# Patient Record
Sex: Female | Born: 1980 | Race: Black or African American | Hispanic: No | Marital: Single | Smoking: Current every day smoker
Health system: Southern US, Community
[De-identification: ages and names within clinical notes are randomized; demographics above are authoritative.]

## PROBLEM LIST (undated history)

## (undated) ENCOUNTER — Inpatient Hospital Stay (HOSPITAL_COMMUNITY): Payer: Self-pay

## (undated) DIAGNOSIS — O139 Gestational [pregnancy-induced] hypertension without significant proteinuria, unspecified trimester: Secondary | ICD-10-CM

## (undated) DIAGNOSIS — Z8759 Personal history of other complications of pregnancy, childbirth and the puerperium: Secondary | ICD-10-CM

## (undated) HISTORY — DX: Personal history of other complications of pregnancy, childbirth and the puerperium: Z87.59

## (undated) HISTORY — PX: DILATION AND CURETTAGE OF UTERUS: SHX78

## (undated) HISTORY — PX: FOOT SURGERY: SHX648

---
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
## 2003-12-10 ENCOUNTER — Emergency Department (HOSPITAL_COMMUNITY): Admission: EM | Admit: 2003-12-10 | Discharge: 2003-12-11 | Payer: Self-pay | Admitting: Emergency Medicine

## 2006-09-04 ENCOUNTER — Inpatient Hospital Stay (HOSPITAL_COMMUNITY): Admission: AD | Admit: 2006-09-04 | Discharge: 2006-09-04 | Payer: Self-pay | Admitting: Gynecology

## 2006-09-20 ENCOUNTER — Ambulatory Visit: Payer: Self-pay | Admitting: Obstetrics & Gynecology

## 2006-09-26 ENCOUNTER — Ambulatory Visit (HOSPITAL_COMMUNITY): Admission: RE | Admit: 2006-09-26 | Discharge: 2006-09-26 | Payer: Self-pay | Admitting: Gynecology

## 2006-10-09 ENCOUNTER — Ambulatory Visit: Payer: Self-pay | Admitting: Gynecology

## 2006-10-09 ENCOUNTER — Encounter (INDEPENDENT_AMBULATORY_CARE_PROVIDER_SITE_OTHER): Payer: Self-pay | Admitting: Gynecology

## 2006-10-24 ENCOUNTER — Ambulatory Visit (HOSPITAL_COMMUNITY): Admission: RE | Admit: 2006-10-24 | Discharge: 2006-10-24 | Payer: Self-pay | Admitting: Gynecology

## 2006-11-01 ENCOUNTER — Ambulatory Visit: Payer: Self-pay | Admitting: Obstetrics & Gynecology

## 2006-11-10 ENCOUNTER — Ambulatory Visit: Payer: Self-pay | Admitting: Obstetrics & Gynecology

## 2006-11-29 ENCOUNTER — Ambulatory Visit: Payer: Self-pay | Admitting: Obstetrics & Gynecology

## 2006-12-20 ENCOUNTER — Ambulatory Visit: Payer: Self-pay | Admitting: Obstetrics & Gynecology

## 2007-01-03 ENCOUNTER — Ambulatory Visit: Payer: Self-pay | Admitting: Obstetrics & Gynecology

## 2007-01-10 ENCOUNTER — Ambulatory Visit: Payer: Self-pay | Admitting: Obstetrics & Gynecology

## 2007-01-15 ENCOUNTER — Ambulatory Visit: Payer: Self-pay | Admitting: Gynecology

## 2007-01-31 ENCOUNTER — Ambulatory Visit: Payer: Self-pay | Admitting: Obstetrics & Gynecology

## 2007-02-05 ENCOUNTER — Emergency Department (HOSPITAL_COMMUNITY): Admission: EM | Admit: 2007-02-05 | Discharge: 2007-02-05 | Payer: Self-pay | Admitting: Family Medicine

## 2007-02-07 ENCOUNTER — Ambulatory Visit (HOSPITAL_COMMUNITY): Admission: RE | Admit: 2007-02-07 | Discharge: 2007-02-07 | Payer: Self-pay | Admitting: Obstetrics & Gynecology

## 2007-02-21 ENCOUNTER — Ambulatory Visit: Payer: Self-pay | Admitting: Obstetrics & Gynecology

## 2007-02-26 ENCOUNTER — Ambulatory Visit (HOSPITAL_COMMUNITY): Admission: RE | Admit: 2007-02-26 | Discharge: 2007-02-26 | Payer: Self-pay | Admitting: Obstetrics & Gynecology

## 2007-02-26 ENCOUNTER — Ambulatory Visit: Payer: Self-pay | Admitting: Gynecology

## 2007-02-28 ENCOUNTER — Ambulatory Visit (HOSPITAL_COMMUNITY): Admission: RE | Admit: 2007-02-28 | Discharge: 2007-02-28 | Payer: Self-pay | Admitting: Obstetrics & Gynecology

## 2007-03-05 ENCOUNTER — Ambulatory Visit: Payer: Self-pay | Admitting: Gynecology

## 2007-03-07 ENCOUNTER — Ambulatory Visit: Payer: Self-pay | Admitting: Gynecology

## 2007-03-13 ENCOUNTER — Ambulatory Visit: Payer: Self-pay | Admitting: Gynecology

## 2007-03-15 ENCOUNTER — Ambulatory Visit: Payer: Self-pay | Admitting: Obstetrics & Gynecology

## 2007-03-20 ENCOUNTER — Ambulatory Visit: Payer: Self-pay | Admitting: Physician Assistant

## 2007-03-20 ENCOUNTER — Inpatient Hospital Stay (HOSPITAL_COMMUNITY): Admission: RE | Admit: 2007-03-20 | Discharge: 2007-03-23 | Payer: Self-pay | Admitting: Obstetrics & Gynecology

## 2007-05-09 ENCOUNTER — Ambulatory Visit: Payer: Self-pay | Admitting: Obstetrics & Gynecology

## 2007-05-16 ENCOUNTER — Ambulatory Visit: Payer: Self-pay | Admitting: Obstetrics & Gynecology

## 2008-07-18 ENCOUNTER — Emergency Department (HOSPITAL_COMMUNITY): Admission: EM | Admit: 2008-07-18 | Discharge: 2008-07-18 | Payer: Self-pay | Admitting: Family Medicine

## 2009-06-15 ENCOUNTER — Emergency Department (HOSPITAL_COMMUNITY): Admission: EM | Admit: 2009-06-15 | Discharge: 2009-06-15 | Payer: Self-pay | Admitting: Emergency Medicine

## 2010-06-21 LAB — RAPID STREP SCREEN (MED CTR MEBANE ONLY): Streptococcus, Group A Screen (Direct): POSITIVE — AB

## 2010-08-10 NOTE — Assessment & Plan Note (Signed)
NAME:  Beth Park, Beth Park               ACCOUNT NO.:  [id number]   MEDICAL RECORD NO.:  [id number]          PATIENT TYPE:  POB   LOCATION:  [facility] at [facility]         FACILITY:  [facility]   PHYSICIAN:  Tinnie Gens, MD        DATE OF BIRTH:  Feb 05, 1981   DATE OF SERVICE:                                  CLINIC NOTE   CHIEF COMPLAINT:  IUD insertion.   HISTORY OF PRESENT ILLNESS:  The patient is a 30 year old gravida 3,  para 2-1-0-2 who is 6 weeks status post delivery of a 6 pound 7 ounce  female fetus.  She is her for IUD placement. She had a fasting blood sugar  today was 86.  EPT was negative on exam. Vitals are as in the chart.  She is a well-developed, well-nourished female, no acute distress.   PROCEDURE:  __________ was placed on the vagina.  Cervix visualized,  cleaned with Betadine x2. Cervix grasped anteriorly with a single-tooth  tenaculum.  Uterus sounded to 8-1/2 cm. Mirena IUD placed without  difficulty.  Strings trimmed to 1/2 cm all from the vagina.  The patient  tolerated procedure well.   IMPRESSION:  1. Undesired fertility.  2. Status post IUD placement.   PLAN:  Follow-up 1-2 months for IUD string check.           ______________________________  Tinnie Gens, MD     TP/MEDQ  D:  05/16/2007  T:  05/17/2007  Job:  [id number]

## 2010-08-10 NOTE — Assessment & Plan Note (Signed)
NAME:  Beth Park, Beth Park               ACCOUNT NO.:  [id number]   MEDICAL RECORD NO.:  [id number]          PATIENT TYPE:  POB   LOCATION:  [facility] at [facility]         FACILITY:  [facility]   PHYSICIAN:  Tinnie Gens, MD        DATE OF BIRTH:  [birth year]   DATE OF SERVICE:                                  CLINIC NOTE   CHIEF COMPLAINT:  Postpartum check.   HISTORY OF PRESENT ILLNESS:  The patient is a 30 year old gravida 3,  para 2-1-0-2, who is now 6 weeks status post vaginal delivery of a 6-  pound, 7-ounce female fetus.  She has been back to work approximately 3  days.  She is bottle feeding, and she is planning for an IUD next week.  She has not been sexually active since birth.  The patient had abnormal  1 hour, with normal 3-hour, with the exception of an abnormal fasting of  113.  The patient was not treated as a diabetic during this pregnancy.  The patient reports that her mood is better, now she is back at work.  She does not think that she is significantly depressed.  She is just  tired a lot.  Her baby is not sleeping through the night yet.  Her last  Pap smear was in July 2008.   EXAMINATION:  VITAL SIGNS:  On exam, her vitals are as noted in the  chart.  GENERAL:  She well-nourished female in no acute distress.  ABDOMEN:  Soft, nontender, nondistended.  GU:  Normal external female genitalia.  Vagina is pink and rugated.  BUS  is normal. Cervix is parous without lesion.  Uterus was 6 weeks' size,  anteverted.  No adnexal mass or tenderness.   IMPRESSION:  1. Postpartum check.  2. History of abnormal fasting blood sugar during pregnancy.   PLAN:  1. Will check her random CABG at her next visit.  2. Return 1 week for IUD placement.  3. GC and chlamydia cultures were obtained today.           ______________________________  Tinnie Gens, MD     TP/MEDQ  D:  05/09/2007  T:  05/10/2007  Job:  [id number]

## 2010-08-13 NOTE — Discharge Summary (Signed)
[facility]  Patient:    Beth Park, Beth Park                      MRN: [id number] Adm. Date:  [id number] Disc. Date: [id number] Attending:  Antionette Char Dictator:   Ocie Doyne, M.D.                           Discharge Summary  DATE OF BIRTH:                03-Jul-1980  DISCHARGE DIAGNOSES:          1. Eclampsia on postpartum day nine.                               2. Pyelonephritis.  DISCHARGE MEDICATIONS:        1. Bactrim DS one p.o. b.i.d. for nine days.                               2. Labetalol 100 mg p.o. b.i.d.                               3. Prenatal vitamin p.o. q.d.  ADMISSION HISTORY AND PHYSICAL:                 This 30 year old, G2, P1-0-1-1, on postpartum day nine following a spontaneous vaginal delivery presented to [facility] Emergency Department following a three-day history of headache unrelieved by Tylenol or Motrin. The morning of admission, while in the bathroom at home, she had a grand mal seizure, lost consciousness, hit her head, bit her tongue, and lost control of her bladder. The seizure was witnessed by family members. She was brought to the [facility] Emergency Department by EMS where she received a 6 g bolus of magnesium sulfate and [facility]. Her pg had not been complicated by hypertension or other evidence of preeclampsia and for the first week after discharge home, she had been doing well. On admission exam her blood pressure was 147/81, heart rate 93, and she was tired but oriented. Pertinent exam findings included: Lungs clear to auscultation. No right upper quadrant tenderness. No CVA tenderness. She did have both pedal and hand edema. She did not have clonus but did have brisk reflexes.  ADMISSION LABORATORIES:       Sodium 136, potassium 4.0, chloride 109, bicarb 24, BUN 15, creatinine 0.9, glucose 81. White blood cells 10.9, hemoglobin 12.6, platelets of 321,000. LDH 253, uric  acid 5.5, AST 32, ALT 70, and a magnesium level on arrival was 3.1. PT was 12.2, PTT 26.  Urinalysis: positive nitrites, small blood, and low protein. Microscopic analysis showed 6-10 red blood cells, 11-20 white blood cells, and many bacteria. A urine drug screen was positive for opiates and marijuana.  Head CT was negative for an acute bleed.  HOSPITAL COURSE:              The patient was admitted to the AICU for close observation and continued on magnesium sulfate at 2.5 g per hour. She was started on Cefotan for presumptive pyelonephritis as shortly following admission, she developed a fever to 100.6 and a catheterized urine sample was cultured. Her labs began slowly trending downward and on hospital day  three, she began to diurese. At that time she was feeling well, denied headache or vision changes, and was no longer spiking fevers. Her urine culture revealed 1000 colonies of E. coli but had been collected after a dose of IM Rocephin had already been administered. On hospital day four, as her labs continued to normalize, magnesium sulfate was discontinued and she was transferred the patient to the antenatal unit. Unfortunately, shortly following her transfer, she left her room to go outside and smoke and when she returned to her room was noted to have a blood pressure 177/113. Because it could not be determined whether her hypertension was secondary to preeclampsia or to nicotine abuse, she returned to the AICU and was restarted on magnesium for an additional 24 hours. She also began labetalol 100 mg b.i.d.  On hospital day six her labs had almost entirely normalized. She continued to diurese, had an unremarkable physical exam, was denying headache, left upper quadrant abdominal pain or vision changes, and had been afebrile for greater than 24 hours. She was changed to Bactrim DS to continue a 14-day course for pyelonephritis. Her magnesium sulfate was discontinued and she was  monitored closely over the next 12 hours during which time she remained stable. She will be discharged home to continue a 14-day course of Bactrim and continue on labetalol until she is followed up at [facility] for her six week postpartum visit. DD:  06/10/00 TD:  06/11/00 Job: [id number]

## 2010-12-12 ENCOUNTER — Emergency Department (HOSPITAL_COMMUNITY)
Admission: EM | Admit: 2010-12-12 | Discharge: 2010-12-13 | Disposition: A | Discharge status: Home / Self Care | Payer: BC Managed Care – PPO | Attending: Emergency Medicine | Admitting: Emergency Medicine

## 2010-12-12 DIAGNOSIS — L0201 Cutaneous abscess of face: Secondary | ICD-10-CM | POA: Insufficient documentation

## 2010-12-12 DIAGNOSIS — B9689 Other specified bacterial agents as the cause of diseases classified elsewhere: Secondary | ICD-10-CM | POA: Insufficient documentation

## 2010-12-12 DIAGNOSIS — A499 Bacterial infection, unspecified: Secondary | ICD-10-CM | POA: Insufficient documentation

## 2010-12-12 DIAGNOSIS — R109 Unspecified abdominal pain: Secondary | ICD-10-CM | POA: Insufficient documentation

## 2010-12-12 DIAGNOSIS — L03211 Cellulitis of face: Secondary | ICD-10-CM | POA: Insufficient documentation

## 2010-12-12 DIAGNOSIS — N760 Acute vaginitis: Secondary | ICD-10-CM | POA: Insufficient documentation

## 2010-12-12 LAB — URINALYSIS, ROUTINE W REFLEX MICROSCOPIC
Nitrite: NEGATIVE
Specific Gravity, Urine: 1.034 — ABNORMAL HIGH (ref 1.005–1.030)
Urobilinogen, UA: 1 mg/dL (ref 0.0–1.0)

## 2010-12-12 LAB — CBC
Hemoglobin: 12.9 g/dL (ref 12.0–15.0)
MCH: 29.3 pg (ref 26.0–34.0)
MCV: 83.4 fL (ref 78.0–100.0)
Platelets: 266 10*3/uL (ref 150–400)
RBC: 4.4 MIL/uL (ref 3.87–5.11)

## 2010-12-12 LAB — POCT PREGNANCY, URINE: Preg Test, Ur: NEGATIVE

## 2010-12-12 LAB — URINE MICROSCOPIC-ADD ON

## 2010-12-13 LAB — COMPREHENSIVE METABOLIC PANEL
BUN: 15 mg/dL (ref 6–23)
CO2: 27 mEq/L (ref 19–32)
Calcium: 9.4 mg/dL (ref 8.4–10.5)
Creatinine, Ser: 0.81 mg/dL (ref 0.50–1.10)
GFR calc Af Amer: >60 mL/min (ref >60)
GFR calc non Af Amer: >60 mL/min (ref >60)
Glucose, Bld: 97 mg/dL (ref 70–99)

## 2010-12-13 LAB — DIFFERENTIAL
Eosinophils Absolute: 0.2 10*3/uL (ref 0.0–0.7)
Lymphs Abs: 5.8 10*3/uL — ABNORMAL HIGH (ref 0.7–4.0)
Monocytes Relative: 5 % (ref 3–12)
Neutrophils Relative %: 39 % — ABNORMAL LOW (ref 43–77)

## 2010-12-13 LAB — WET PREP, GENITAL: Trich, Wet Prep: NONE SEEN

## 2010-12-31 LAB — COMPREHENSIVE METABOLIC PANEL
Albumin: 2.6 — ABNORMAL LOW
BUN: 7
Chloride: 103
Creatinine, Ser: 0.59
Glucose, Bld: 122 — ABNORMAL HIGH
Total Bilirubin: 0.4
Total Protein: 5.8 — ABNORMAL LOW

## 2010-12-31 LAB — WOUND CULTURE: Gram Stain: NONE SEEN

## 2010-12-31 LAB — CBC
HCT: 34.2 — ABNORMAL LOW
Hemoglobin: 13
MCV: 86.6
Platelets: 174
RDW: 13.3
RDW: 13.6

## 2010-12-31 LAB — RPR: RPR Ser Ql: NONREACTIVE

## 2010-12-31 LAB — URIC ACID: Uric Acid, Serum: 3.2

## 2010-12-31 LAB — LACTATE DEHYDROGENASE: LDH: 128

## 2011-01-13 LAB — URINALYSIS, ROUTINE W REFLEX MICROSCOPIC
Bilirubin Urine: NEGATIVE
Glucose, UA: NEGATIVE
Hgb urine dipstick: NEGATIVE
Ketones, ur: NEGATIVE
Nitrite: POSITIVE — AB
pH: 6

## 2011-01-13 LAB — URINE MICROSCOPIC-ADD ON

## 2011-08-20 ENCOUNTER — Emergency Department (HOSPITAL_COMMUNITY)
Admission: EM | Admit: 2011-08-20 | Discharge: 2011-08-20 | Disposition: A | Discharge status: Home / Self Care | Payer: BC Managed Care – PPO | Attending: Emergency Medicine | Admitting: Emergency Medicine

## 2011-08-20 ENCOUNTER — Encounter (HOSPITAL_COMMUNITY): Payer: Self-pay | Admitting: Emergency Medicine

## 2011-08-20 DIAGNOSIS — W57XXXA Bitten or stung by nonvenomous insect and other nonvenomous arthropods, initial encounter: Secondary | ICD-10-CM | POA: Insufficient documentation

## 2011-08-20 DIAGNOSIS — T148 Other injury of unspecified body region: Secondary | ICD-10-CM | POA: Insufficient documentation

## 2011-08-20 DIAGNOSIS — F17200 Nicotine dependence, unspecified, uncomplicated: Secondary | ICD-10-CM | POA: Insufficient documentation

## 2011-08-20 NOTE — ED Provider Notes (Signed)
History     CSN: [id number]  Arrival date & time 08/20/11  0058   First MD Initiated Contact with Patient 08/20/11 0236      Chief Complaint  Patient presents with  . skin irritation   . Mass    HPI  History provided by the patient. She is a 31 year old female with no significant past medical history who presents with concerns for bumps on left wrist and forehead.she first noticed these 2 days ago. Bones been associated with itching. Patient was concerned because people noticed a bump on her for head and has swollen some. Patient denies any pain to the areas. She is unaware of any insect bites. She has not slept in many locations. She is not noting new pet contacts.she has not tried anything for her symptoms. She denies any other aggravating or alleviating factors.Patient denies any other symptoms.patient denies any fever, chills, sweats    History reviewed. No pertinent past medical history.  Past Surgical History  Procedure Date  . Dilation and curettage of uterus     History reviewed. No pertinent family history.  History  Substance Use Topics  . Smoking status: Current Everyday Smoker    Types: Cigarettes  . Smokeless tobacco: Not on file  . Alcohol Use: Yes    OB History    Grav Para Term Preterm Abortions TAB SAB Ect Mult Living                  Review of Systems  Constitutional: Negative for fever and chills.  Skin: Negative for rash.    Allergies  Review of patient's allergies indicates no known allergies.  Home Medications  No current outpatient prescriptions on file.  BP 131/76  Pulse 84  Temp(Src) 98.4 F (36.9 C) (Oral)  Resp 16  Wt 200 lb (90.719 kg)  SpO2 100%  LMP 08/09/2011  Physical Exam  Nursing note and vitals reviewed. Constitutional: She is oriented to person, place, and time. She appears well-developed and well-nourished. No distress.  HENT:  Head: Normocephalic and atraumatic.  Cardiovascular: Normal rate and regular rhythm.     Pulmonary/Chest: Effort normal and breath sounds normal.  Neurological: She is alert and oriented to person, place, and time.  Skin: Skin is warm and dry.        Single macular papular lesion on the anterior medial left wrist.similar macular papular lesion on the left for head.  Psychiatric: She has a normal mood and affect. Her behavior is normal.    ED Course  Procedures     1. Insect bites       MDM  Patient seen and evaluated. Patient no acute distress the        Angus Seller, [place] 08/20/11 [phone]

## 2011-08-20 NOTE — ED Notes (Signed)
Pt presented with complains that she develop 2 bumps, 1st located to the left wrist and 2nd one to her forehead, pt states " at first I thought it was mosquito bite but I am not sure", denies pain to the area, noted minimal swelling and redness

## 2011-08-20 NOTE — ED Provider Notes (Signed)
Medical screening examination/treatment/procedure(s) were performed by non-physician practitioner and as supervising physician I was immediately available for consultation/collaboration.   Aahan Marques L Kynslee Baham, MD 08/20/11 0713 

## 2011-08-20 NOTE — Discharge Instructions (Signed)
You were seen and evaluated for lumps on your wrist and forehead. At this time your providers do not think these are caused from any emergent condition. Your providers do feel these could be insect bites that cause swelling and itching. These will resolve on their own in the next few days. You can use warm compresses over the area to help with improvement. Avoid scratching the skin.   Insect Bite Mosquitoes, flies, fleas, bedbugs, and many other insects can bite. Insect bites are different from insect stings. A sting is when venom is injected into the skin. Some insect bites can transmit infectious diseases. SYMPTOMS  Insect bites usually turn red, swell, and itch for 2 to 4 days. They often go away on their own. TREATMENT  Your caregiver may prescribe antibiotic medicines if a bacterial infection develops in the bite. HOME CARE INSTRUCTIONS  Do not scratch the bite area.   Keep the bite area clean and dry. Wash the bite area thoroughly with soap and water.   Put ice or cool compresses on the bite area.   Put ice in a plastic bag.   Place a towel between your skin and the bag.   Leave the ice on for 20 minutes, 4 times a day for the first 2 to 3 days, or as directed.   You may apply a baking soda paste, cortisone cream, or calamine lotion to the bite area as directed by your caregiver. This can help reduce itching and swelling.   Only take over-the-counter or prescription medicines as directed by your caregiver.   If you are given antibiotics, take them as directed. Finish them even if you start to feel better.  You may need a tetanus shot if:  You cannot remember when you had your last tetanus shot.   You have never had a tetanus shot.   The injury broke your skin.  If you get a tetanus shot, your arm may swell, get red, and feel warm to the touch. This is common and not a problem. If you need a tetanus shot and you choose not to have one, there is a rare chance of getting  tetanus. Sickness from tetanus can be serious. SEEK IMMEDIATE MEDICAL CARE IF:   You have increased pain, redness, or swelling in the bite area.   You see a red line on the skin coming from the bite.   You have a fever.   You have joint pain.   You have a headache or neck pain.   You have unusual weakness.   You have a rash.   You have chest pain or shortness of breath.   You have abdominal pain, nausea, or vomiting.   You feel unusually tired or sleepy.  MAKE SURE YOU:   Understand these instructions.   Will watch your condition.   Will get help right away if you are not doing well or get worse.  Document Released: 04/21/2004 Document Revised: 03/03/2011 Document Reviewed: 10/13/2010 [facility] Patient Information 2012 [place], [place].   RESOURCE GUIDE  Dental Problems  Patients with Medicaid: [facility] [phone]                                           [phone] [address]  [employer] Phone:  [phone]                                                  Phone:  [phone]  If unable to pay or uninsured, contact:  [facility] or [facility]. to become qualified for the adult dental clinic.  Chronic Pain Problems Contact [facility]  ([phone] Patients need to be referred by their primary care doctor.  Insufficient Money for Medicine Contact [employer]:  call "211" or [facility] 304-204-7274.  No Primary Care Doctor Call [facility]  740 323 6015 Other agencies that provide inexpensive medical care    [facility]  ([phone]    [facility]  [phone]    [facility]  ([phone]    [facility]  670-865-9995    [facility]  513-687-1968    [facility]  [phone]  Psychological Services [facility]  ([phone] [facility] Services  [phone] [facility]   [phone] (emergency services  870-466-0447)  Substance Abuse Resources [facility]  (386)392-4630 [facility] (563)611-8405 [facility] 570-325-4116 Floydene Flock 475-425-4247 Residential & Outpatient Substance Abuse Program  [phone]  Abuse/Neglect [facility] Child Abuse Hotline 305-133-3354 [facility] Child Abuse Hotline 270-295-9259 (After Hours)  Emergency Shelter [facility] 718 279 5375  Maternity [facility] [place] [facility] (787) 831-9404 [facility] 6231435099  MRSA Hotline #:   225-780-4327    [facility] Resources  [facility] [place]     [employer]                          [facility] Dept. [address] [place]                       [address]      [address]  Blondell Reveal Phone:  [phone]                                   Phone:  [phone]                 Phone:  [phone]  [facility] Phone:  [phone]  [facility] Child Abuse Hotline (315)652-8497 417-823-5990 (After Hours)

## 2011-08-20 NOTE — ED Notes (Signed)
Bump to L side of forehead and L wrist. Pt states these bumps have gotten bigger. Denies pain at site.

## 2012-03-12 ENCOUNTER — Ambulatory Visit: Payer: BC Managed Care – PPO | Admitting: Obstetrics & Gynecology

## 2012-03-12 DIAGNOSIS — Z01419 Encounter for gynecological examination (general) (routine) without abnormal findings: Secondary | ICD-10-CM

## 2014-09-05 ENCOUNTER — Inpatient Hospital Stay (HOSPITAL_COMMUNITY): Payer: Self-pay

## 2014-09-05 ENCOUNTER — Inpatient Hospital Stay (HOSPITAL_COMMUNITY)
Admission: AD | Admit: 2014-09-05 | Discharge: 2014-09-05 | Disposition: A | Discharge status: Home / Self Care | Payer: Self-pay | Source: Ambulatory Visit | Attending: Obstetrics & Gynecology | Admitting: Obstetrics & Gynecology

## 2014-09-05 ENCOUNTER — Encounter (HOSPITAL_COMMUNITY): Payer: Self-pay | Admitting: *Deleted

## 2014-09-05 DIAGNOSIS — O208 Other hemorrhage in early pregnancy: Secondary | ICD-10-CM | POA: Insufficient documentation

## 2014-09-05 DIAGNOSIS — Z3A08 8 weeks gestation of pregnancy: Secondary | ICD-10-CM | POA: Insufficient documentation

## 2014-09-05 DIAGNOSIS — O418X10 Other specified disorders of amniotic fluid and membranes, first trimester, not applicable or unspecified: Secondary | ICD-10-CM

## 2014-09-05 DIAGNOSIS — O210 Mild hyperemesis gravidarum: Secondary | ICD-10-CM | POA: Insufficient documentation

## 2014-09-05 DIAGNOSIS — O4691 Antepartum hemorrhage, unspecified, first trimester: Secondary | ICD-10-CM

## 2014-09-05 DIAGNOSIS — O209 Hemorrhage in early pregnancy, unspecified: Secondary | ICD-10-CM

## 2014-09-05 DIAGNOSIS — O468X1 Other antepartum hemorrhage, first trimester: Secondary | ICD-10-CM

## 2014-09-05 HISTORY — DX: Gestational (pregnancy-induced) hypertension without significant proteinuria, unspecified trimester: O13.9

## 2014-09-05 LAB — URINALYSIS, ROUTINE W REFLEX MICROSCOPIC
BILIRUBIN URINE: NEGATIVE
GLUCOSE, UA: NEGATIVE mg/dL
Ketones, ur: NEGATIVE mg/dL
LEUKOCYTES UA: NEGATIVE
NITRITE: NEGATIVE
PH: 6 (ref 5.0–8.0)
Protein, ur: NEGATIVE mg/dL
Specific Gravity, Urine: 1.025 (ref 1.005–1.030)
Urobilinogen, UA: 0.2 mg/dL (ref 0.0–1.0)

## 2014-09-05 LAB — CBC WITH DIFFERENTIAL/PLATELET
BASOS PCT: 0 % (ref 0–1)
Basophils Absolute: 0 10*3/uL (ref 0.0–0.1)
EOS PCT: 2 % (ref 0–5)
Eosinophils Absolute: 0.2 10*3/uL (ref 0.0–0.7)
HCT: 35.2 % — ABNORMAL LOW (ref 36.0–46.0)
Hemoglobin: 12.2 g/dL (ref 12.0–15.0)
Lymphocytes Relative: 35 % (ref 12–46)
Lymphs Abs: 4.1 10*3/uL — ABNORMAL HIGH (ref 0.7–4.0)
MCH: 29 pg (ref 26.0–34.0)
MCHC: 34.7 g/dL (ref 30.0–36.0)
MCV: 83.6 fL (ref 78.0–100.0)
MONO ABS: 0.7 10*3/uL (ref 0.1–1.0)
MONOS PCT: 6 % (ref 3–12)
NEUTROS PCT: 57 % (ref 43–77)
Neutro Abs: 6.8 10*3/uL (ref 1.7–7.7)
Platelets: 239 10*3/uL (ref 150–400)
RBC: 4.21 MIL/uL (ref 3.87–5.11)
RDW: 13.2 % (ref 11.5–15.5)
WBC: 11.8 10*3/uL — ABNORMAL HIGH (ref 4.0–10.5)

## 2014-09-05 LAB — URINE MICROSCOPIC-ADD ON

## 2014-09-05 LAB — ABO/RH: ABO/RH(D): O POS

## 2014-09-05 LAB — WET PREP, GENITAL
TRICH WET PREP: NONE SEEN
Yeast Wet Prep HPF POC: NONE SEEN

## 2014-09-05 LAB — POCT PREGNANCY, URINE: PREG TEST UR: POSITIVE — AB

## 2014-09-05 LAB — HCG, QUANTITATIVE, PREGNANCY: HCG, BETA CHAIN, QUANT, S: 111409 m[IU]/mL — AB (ref <5)

## 2014-09-05 NOTE — MAU Provider Note (Signed)
History     CSN: [id number]  Arrival date and time: 09/05/14 1404   First Provider Initiated Contact with Patient 09/05/14 1627      Chief Complaint  Patient presents with  . Abdominal Pain  . Vaginal Bleeding  . Possible Pregnancy   HPI Beth Park 34 y.o. [id number] @[redacted]w[redacted]d  presents to MAU with a couple weeks of abdominal pain, spotting and nausea and vomiting.  Pain is 1/47 presently but certain positions bring it on and it can be 4/10 and very quick.  It is sharp.   It can happen more than once in a day but does not interfere with ADLs.  She feels full more.    Nausea and vomiting are unrelated to pain.  This comes more with hunger but any time of day.  Worse with coughing.  She has not tried any medication for these symptoms.  She has noticed spotting on wiping only.  She wears a panty liner but it never gets on there.  No spotting today.  Denies fever, weakness, SOB, CP.   She does not yet have [facility] provider.   OB History    Gravida Para Term Preterm AB TAB SAB Ectopic Multiple Living   4 2 2  1  1   2       Past Medical History  Diagnosis Date  . Pregnancy induced hypertension     Past Surgical History  Procedure Laterality Date  . Dilation and curettage of uterus      History reviewed. No pertinent family history.  History  Substance Use Topics  . Smoking status: Current Every Day Smoker -- 0.50 packs/day    Types: Cigarettes  . Smokeless tobacco: Not on file  . Alcohol Use: No    Allergies: No Known Allergies  Prescriptions prior to admission  Medication Sig Dispense Refill Last Dose  . Multiple Vitamins-Minerals (MULTIVITAMIN PO) Take 1 tablet by mouth at bedtime.   Past Week at Unknown time    ROS Pertinent ROS in HPI.  All other systems are negative.   Physical Exam   Blood pressure 113/70, pulse 99, temperature 98.9 F (37.2 C), temperature source Oral, resp. rate 18, height 5\' 5"  (1.651 m), weight 226 lb (102.513 kg), last menstrual period  07/09/2014.  Physical Exam  Constitutional: She is oriented to person, place, and time. She appears well-developed and well-nourished. No distress.  HENT:  Head: Normocephalic and atraumatic.  Eyes: EOM are normal.  Neck: Normal range of motion.  Cardiovascular: Normal rate, regular rhythm and normal heart sounds.   Respiratory: Effort normal and breath sounds normal. No respiratory distress.  GI: Soft. She exhibits no distension.  Genitourinary:  NO visible blood.  Scant white discharge Cervix is closed.  No CMT.  NO adnexal mass or tenderness  Musculoskeletal: Normal range of motion.  Neurological: She is alert and oriented to person, place, and time.  Skin: Skin is warm and dry.  Psychiatric: She has a normal mood and affect.    MAU Course  Procedures  MDM Ectopic workup ordered to confirm location of pregnancy due to pt complaints of abdominal pain and bleeding despite no abdominal tenderness on exam and no vaginal bleeding on exam.  Results for orders placed or performed during the hospital encounter of 09/05/14 (from the past 24 hour(s))  Urinalysis, Routine w reflex microscopic (not at [facility])     Status: Abnormal   Collection Time: 09/05/14  2:25 PM  Result Value Ref Range   Color, Urine  YELLOW YELLOW   APPearance CLEAR CLEAR   Specific Gravity, Urine 1.025 1.005 - 1.030   pH 6.0 5.0 - 8.0   Glucose, UA NEGATIVE NEGATIVE mg/dL   Hgb urine dipstick TRACE (A) NEGATIVE   Bilirubin Urine NEGATIVE NEGATIVE   Ketones, ur NEGATIVE NEGATIVE mg/dL   Protein, ur NEGATIVE NEGATIVE mg/dL   Urobilinogen, UA 0.2 0.0 - 1.0 mg/dL   Nitrite NEGATIVE NEGATIVE   Leukocytes, UA NEGATIVE NEGATIVE  Urine microscopic-add on     Status: None   Collection Time: 09/05/14  2:25 PM  Result Value Ref Range   Squamous Epithelial / LPF RARE RARE   WBC, UA 0-2 <3 WBC/hpf   RBC / HPF 0-2 <3 RBC/hpf   Bacteria, UA RARE RARE   Urine-Other MUCOUS PRESENT   Pregnancy, urine POC     Status:  Abnormal   Collection Time: 09/05/14  2:30 PM  Result Value Ref Range   Preg Test, Ur POSITIVE (A) NEGATIVE  Wet prep, genital     Status: Abnormal   Collection Time: 09/05/14  4:36 PM  Result Value Ref Range   Yeast Wet Prep HPF POC NONE SEEN NONE SEEN   Trich, Wet Prep NONE SEEN NONE SEEN   Clue Cells Wet Prep HPF POC FEW (A) NONE SEEN   WBC, Wet Prep HPF POC FEW (A) NONE SEEN  CBC with Differential/Platelet     Status: Abnormal   Collection Time: 09/05/14  4:58 PM  Result Value Ref Range   WBC 11.8 (H) 4.0 - 10.5 K/uL   RBC 4.21 3.87 - 5.11 MIL/uL   Hemoglobin 12.2 12.0 - 15.0 g/dL   HCT 35.2 (L) 36.0 - 46.0 %   MCV 83.6 78.0 - 100.0 fL   MCH 29.0 26.0 - 34.0 pg   MCHC 34.7 30.0 - 36.0 g/dL   RDW 13.2 11.5 - 15.5 %   Platelets 239 150 - 400 K/uL   Neutrophils Relative % 57 43 - 77 %   Neutro Abs 6.8 1.7 - 7.7 K/uL   Lymphocytes Relative 35 12 - 46 %   Lymphs Abs 4.1 (H) 0.7 - 4.0 K/uL   Monocytes Relative 6 3 - 12 %   Monocytes Absolute 0.7 0.1 - 1.0 K/uL   Eosinophils Relative 2 0 - 5 %   Eosinophils Absolute 0.2 0.0 - 0.7 K/uL   Basophils Relative 0 0 - 1 %   Basophils Absolute 0.0 0.0 - 0.1 K/uL  ABO/Rh     Status: None (Preliminary result)   Collection Time: 09/05/14  4:58 PM  Result Value Ref Range   ABO/RH(D) O POS   hCG, quantitative, pregnancy     Status: Abnormal   Collection Time: 09/05/14  4:58 PM  Result Value Ref Range   hCG, Beta Chain, Quant, S 111409 (H) <5 mIU/mL   US Ob Comp Less 14 Wks  09/05/2014   CLINICAL DATA:  Acute onset of vaginal bleeding. Initial encounter. Generalized abdominal pain.  EXAM: OBSTETRIC <14 WK [place] AND TRANSVAGINAL OB US  TECHNIQUE: Both transabdominal and transvaginal ultrasound examinations were performed for complete evaluation of the gestation as well as the maternal uterus, adnexal regions, and pelvic cul-de-sac. Transvaginal technique was performed to assess early pregnancy.  COMPARISON:  Pelvic ultrasound performed  02/28/2007  FINDINGS: Intrauterine gestational sac: Visualized/normal in shape.  Yolk sac:  Yes  Embryo:  Yes  Cardiac Activity: Yes  Heart Rate: 150  bpm  CRL:  1.58 cm   8 w  0 d                  [place] EDC: 04/17/2015  Maternal uterus/adnexae: A small amount of subchorionic hemorrhage is suggested. A 4.4 cm fibroid is noted at the left lateral aspect of the uterus.  The right ovary measures 3.8 x 2.0 x 2.2 cm, while the left ovary is not definitely seen. There is no evidence to suggest ectopic pregnancy.  No free fluid is seen within the pelvic cul-de-sac.  IMPRESSION: 1. Single live intrauterine pregnancy noted, with a crown-rump length of 1.6 cm, corresponding to a gestational age of [redacted] weeks 0 days. This matches the gestational age of [redacted] weeks 2 days by LMP, reflecting an estimated date of delivery of April 15, 2015. 2. Small amount of subchorionic hemorrhage suggested. 3. 4.4 cm uterine fibroid seen.   Electronically Signed   By: Garald Balding M.D.   On: 09/05/2014 18:15   US Ob Transvaginal  09/05/2014   CLINICAL DATA:  Acute onset of vaginal bleeding. Initial encounter. Generalized abdominal pain.  EXAM: OBSTETRIC <14 WK [place] AND TRANSVAGINAL OB US  TECHNIQUE: Both transabdominal and transvaginal ultrasound examinations were performed for complete evaluation of the gestation as well as the maternal uterus, adnexal regions, and pelvic cul-de-sac. Transvaginal technique was performed to assess early pregnancy.  COMPARISON:  Pelvic ultrasound performed 02/28/2007  FINDINGS: Intrauterine gestational sac: Visualized/normal in shape.  Yolk sac:  Yes  Embryo:  Yes  Cardiac Activity: Yes  Heart Rate: 150  bpm  CRL:  1.58 cm   8 w   0 d                  [place] EDC: 04/17/2015  Maternal uterus/adnexae: A small amount of subchorionic hemorrhage is suggested. A 4.4 cm fibroid is noted at the left lateral aspect of the uterus.  The right ovary measures 3.8 x 2.0 x 2.2 cm, while the left ovary is not definitely seen. There is  no evidence to suggest ectopic pregnancy.  No free fluid is seen within the pelvic cul-de-sac.  IMPRESSION: 1. Single live intrauterine pregnancy noted, with a crown-rump length of 1.6 cm, corresponding to a gestational age of [redacted] weeks 0 days. This matches the gestational age of [redacted] weeks 2 days by LMP, reflecting an estimated date of delivery of April 15, 2015. 2. Small amount of subchorionic hemorrhage suggested. 3. 4.4 cm uterine fibroid seen.   Electronically Signed   By: Garald Balding M.D.   On: 09/05/2014 18:15   IUP confirmed (ectopic ruled out).  NO evidence of infection/condition requiring further workup or treatment at this time.  Assessment and Plan  A: Intrauterine pregnancy, Subchorionic hemorrhage  P: Discharge to home Pregnancy verification letter. PNV qd HD for [facility] Patient may return to MAU as needed or if her condition were to change or worsen    Paticia Stack 09/05/2014, 4:28 PM

## 2014-09-05 NOTE — MAU Note (Signed)
Been having some abd pains, past wk. Bled yesterday. Might be preg, +HPT couple wks ago.

## 2014-09-05 NOTE — Discharge Instructions (Signed)
Pelvic Rest Pelvic rest is sometimes recommended for women when:   The placenta is partially or completely covering the opening of the cervix (placenta previa).  There is bleeding between the uterine wall and the amniotic sac in the first trimester (subchorionic hemorrhage).  The cervix begins to open without labor starting (incompetent cervix, cervical insufficiency).  The labor is too early (preterm labor). HOME CARE INSTRUCTIONS  Do not have sexual intercourse, stimulation, or an orgasm.  Do not use tampons, douche, or put anything in the vagina.  Do not lift anything over 10 pounds (4.5 kg).  Avoid strenuous activity or straining your pelvic muscles. SEEK MEDICAL CARE IF:  You have any vaginal bleeding during pregnancy. Treat this as a potential emergency.  You have cramping pain felt low in the stomach (stronger than menstrual cramps).  You notice vaginal discharge (watery, mucus, or bloody).  You have a low, dull backache.  There are regular contractions or uterine tightening. SEEK IMMEDIATE MEDICAL CARE IF: You have vaginal bleeding and have placenta previa.  Document Released: 07/09/2010 Document Revised: 06/06/2011 Document Reviewed: 07/09/2010 [facility] Patient Information 2015 [place], [place]. This information is not intended to replace advice given to you by your health care provider. Make sure you discuss any questions you have with your health care provider.  Vaginal Bleeding During Pregnancy, First Trimester A small amount of bleeding (spotting) from the vagina is common in early pregnancy. Sometimes the bleeding is normal and is not a problem, and sometimes it is a sign of something serious. Be sure to tell your doctor about any bleeding from your vagina right away. HOME CARE  Watch your condition for any changes.  Follow your doctor's instructions about how active you can be.  If you are on bed rest:  You may need to stay in bed and only get up to use the  bathroom.  You may be allowed to do some activities.  If you need help, make plans for someone to help you.  Write down:  The number of pads you use each day.  How often you change pads.  How soaked (saturated) your pads are.  Do not use tampons.  Do not douche.  Do not have sex or orgasms until your doctor says it is okay.  If you pass any tissue from your vagina, save the tissue so you can show it to your doctor.  Only take medicines as told by your doctor.  Do not take aspirin because it can make you bleed.  Keep all follow-up visits as told by your doctor. GET HELP IF:   You bleed from your vagina.  You have cramps.  You have labor pains.  You have a fever that does not go away after you take medicine. GET HELP RIGHT AWAY IF:   You have very bad cramps in your back or belly (abdomen).  You pass large clots or tissue from your vagina.  You bleed more.  You feel light-headed or weak.  You pass out (faint).  You have chills.  You are leaking fluid or have a gush of fluid from your vagina.  You pass out while pooping (having a bowel movement). MAKE SURE YOU:  Understand these instructions.  Will watch your condition.  Will get help right away if you are not doing well or get worse. Document Released: 07/29/2013 Document Reviewed: 11/19/2012 [facility] Patient Information 2015 [place]. This information is not intended to replace advice given to you by your health care provider. Make  sure you discuss any questions you have with your health care provider.

## 2014-09-06 LAB — HIV ANTIBODY (ROUTINE TESTING W REFLEX): HIV SCREEN 4TH GENERATION: NONREACTIVE

## 2014-09-06 LAB — RPR: RPR: NONREACTIVE

## 2014-09-08 LAB — GC/CHLAMYDIA PROBE AMP (~~LOC~~) NOT AT ARMC
Chlamydia: NEGATIVE
Neisseria Gonorrhea: NEGATIVE

## 2014-12-30 LAB — OB RESULTS CONSOLE ABO/RH: RH TYPE: POSITIVE

## 2014-12-30 LAB — OB RESULTS CONSOLE ANTIBODY SCREEN: ANTIBODY SCREEN: NEGATIVE

## 2014-12-30 LAB — OB RESULTS CONSOLE HIV ANTIBODY (ROUTINE TESTING): HIV: NONREACTIVE

## 2014-12-30 LAB — PROCEDURE REPORT - SCANNED: Pap: NEGATIVE

## 2014-12-30 LAB — OB RESULTS CONSOLE GC/CHLAMYDIA
Chlamydia: NEGATIVE
GC PROBE AMP, GENITAL: NEGATIVE

## 2014-12-30 LAB — OB RESULTS CONSOLE HEPATITIS B SURFACE ANTIGEN: HEP B S AG: NEGATIVE

## 2014-12-30 LAB — OB RESULTS CONSOLE RPR: RPR: NONREACTIVE

## 2014-12-30 LAB — OB RESULTS CONSOLE RUBELLA ANTIBODY, IGM: Rubella: IMMUNE

## 2015-03-09 LAB — OB RESULTS CONSOLE GC/CHLAMYDIA
Chlamydia: NEGATIVE
Gonorrhea: NEGATIVE

## 2015-03-09 LAB — OB RESULTS CONSOLE GBS: GBS: NEGATIVE

## 2015-03-29 NOTE — L&D Delivery Note (Signed)
Delivery Note At 1:39 PM a viable female was delivered via Vaginal, Spontaneous Delivery (Presentation: ;  ).  APGAR: 8, 9; weight  .   Placenta status: Intact, Spontaneous.  Cord: 3 vessels with the following complications: None.  Cord pH: not done  Anesthesia: Epidural  Episiotomy: None Lacerations: None Suture Repair: 2.0 Est. Blood Loss (mL):    Mom to postpartum.  Baby to Couplet care / Skin to Skin.  Beth Park A 04/20/2015, 1:45 PM

## 2015-04-16 ENCOUNTER — Other Ambulatory Visit: Payer: Self-pay | Admitting: Obstetrics

## 2015-04-16 ENCOUNTER — Telehealth (HOSPITAL_COMMUNITY): Payer: Self-pay | Admitting: *Deleted

## 2015-04-16 NOTE — Telephone Encounter (Signed)
Preadmission screen  

## 2015-04-20 ENCOUNTER — Inpatient Hospital Stay (HOSPITAL_COMMUNITY)
Admission: RE | Admit: 2015-04-20 | Discharge: 2015-04-22 | DRG: 775 | Disposition: A | Discharge status: Home / Self Care | Payer: Medicaid Other | Source: Ambulatory Visit | Attending: Obstetrics | Admitting: Obstetrics

## 2015-04-20 ENCOUNTER — Encounter (HOSPITAL_COMMUNITY): Payer: Self-pay

## 2015-04-20 ENCOUNTER — Inpatient Hospital Stay (HOSPITAL_COMMUNITY): Payer: Medicaid Other | Admitting: Anesthesiology

## 2015-04-20 DIAGNOSIS — Z3A40 40 weeks gestation of pregnancy: Secondary | ICD-10-CM | POA: Diagnosis not present

## 2015-04-20 DIAGNOSIS — Z3490 Encounter for supervision of normal pregnancy, unspecified, unspecified trimester: Secondary | ICD-10-CM

## 2015-04-20 LAB — CBC
HEMATOCRIT: 34.9 % — AB (ref 36.0–46.0)
Hemoglobin: 12 g/dL (ref 12.0–15.0)
MCH: 29.1 pg (ref 26.0–34.0)
MCHC: 34.4 g/dL (ref 30.0–36.0)
MCV: 84.7 fL (ref 78.0–100.0)
Platelets: 201 10*3/uL (ref 150–400)
RBC: 4.12 MIL/uL (ref 3.87–5.11)
RDW: 13.7 % (ref 11.5–15.5)
WBC: 13.5 10*3/uL — ABNORMAL HIGH (ref 4.0–10.5)

## 2015-04-20 LAB — TYPE AND SCREEN
ABO/RH(D): O POS
Antibody Screen: NEGATIVE

## 2015-04-20 LAB — RPR: RPR: NONREACTIVE

## 2015-04-20 MED ORDER — OXYTOCIN 10 UNIT/ML IJ SOLN: 2.5000 [IU]/h | INTRAVENOUS | Status: DC

## 2015-04-20 MED ORDER — ACETAMINOPHEN 325 MG PO TABS: 650.0000 mg | ORAL_TABLET | ORAL | Status: DC | PRN

## 2015-04-20 MED ORDER — DIBUCAINE 1 % RE OINT: 1.0000 "application " | TOPICAL_OINTMENT | RECTAL | Status: DC | PRN

## 2015-04-20 MED ORDER — ZOLPIDEM TARTRATE 5 MG PO TABS: 5.0000 mg | ORAL_TABLET | Freq: Every evening | ORAL | Status: DC | PRN

## 2015-04-20 MED ORDER — OXYTOCIN BOLUS FROM INFUSION: 500.0000 mL | INTRAVENOUS | Status: DC

## 2015-04-20 MED ORDER — PHENYLEPHRINE 40 MCG/ML (10ML) SYRINGE FOR IV PUSH (FOR BLOOD PRESSURE SUPPORT)
80.0000 ug | PREFILLED_SYRINGE | INTRAVENOUS | Status: DC | PRN
  Filled 2015-04-20: qty 2
  Filled 2015-04-20: qty 20

## 2015-04-20 MED ORDER — CITRIC ACID-SODIUM CITRATE 334-500 MG/5ML PO SOLN: 30.0000 mL | ORAL | Status: DC | PRN

## 2015-04-20 MED ORDER — ONDANSETRON HCL 4 MG/2ML IJ SOLN: 4.0000 mg | Freq: Four times a day (QID) | INTRAMUSCULAR | Status: DC | PRN

## 2015-04-20 MED ORDER — OXYTOCIN 10 UNIT/ML IJ SOLN
1.0000 m[IU]/min | INTRAVENOUS | Status: DC
  Administered 2015-04-20: 1 m[IU]/min via INTRAVENOUS
  Filled 2015-04-20: qty 4

## 2015-04-20 MED ORDER — ONDANSETRON HCL 4 MG/2ML IJ SOLN: 4.0000 mg | INTRAMUSCULAR | Status: DC | PRN

## 2015-04-20 MED ORDER — FERROUS SULFATE 325 (65 FE) MG PO TABS
325.0000 mg | ORAL_TABLET | Freq: Two times a day (BID) | ORAL | Status: DC
  Administered 2015-04-20 – 2015-04-22 (×4): 325 mg via ORAL
  Filled 2015-04-20 (×4): qty 1

## 2015-04-20 MED ORDER — INFLUENZA VAC SPLIT QUAD 0.5 ML IM SUSY
0.5000 mL | PREFILLED_SYRINGE | INTRAMUSCULAR | Status: AC
  Administered 2015-04-21: 0.5 mL via INTRAMUSCULAR
  Filled 2015-04-20: qty 0.5

## 2015-04-20 MED ORDER — LACTATED RINGERS IV SOLN: 500.0000 mL | INTRAVENOUS | Status: DC | PRN

## 2015-04-20 MED ORDER — OXYCODONE-ACETAMINOPHEN 5-325 MG PO TABS: 2.0000 | ORAL_TABLET | ORAL | Status: DC | PRN

## 2015-04-20 MED ORDER — ONDANSETRON HCL 4 MG PO TABS: 4.0000 mg | ORAL_TABLET | ORAL | Status: DC | PRN

## 2015-04-20 MED ORDER — DIPHENHYDRAMINE HCL 50 MG/ML IJ SOLN: 12.5000 mg | INTRAMUSCULAR | Status: DC | PRN

## 2015-04-20 MED ORDER — OXYCODONE-ACETAMINOPHEN 5-325 MG PO TABS
1.0000 | ORAL_TABLET | ORAL | Status: DC | PRN
  Administered 2015-04-22: 1 via ORAL
  Filled 2015-04-20: qty 1

## 2015-04-20 MED ORDER — IBUPROFEN 600 MG PO TABS
600.0000 mg | ORAL_TABLET | Freq: Four times a day (QID) | ORAL | Status: DC
Start: 2015-04-20 — End: 2015-04-22
  Administered 2015-04-20 – 2015-04-22 (×8): 600 mg via ORAL
  Filled 2015-04-20 (×8): qty 1

## 2015-04-20 MED ORDER — LIDOCAINE HCL (PF) 1 % IJ SOLN
30.0000 mL | INTRAMUSCULAR | Status: DC | PRN
  Filled 2015-04-20: qty 30

## 2015-04-20 MED ORDER — FENTANYL 2.5 MCG/ML BUPIVACAINE 1/10 % EPIDURAL INFUSION (WH - ANES)
14.0000 mL/h | INTRAMUSCULAR | Status: DC | PRN
  Administered 2015-04-20: 14 mL/h via EPIDURAL
  Filled 2015-04-20: qty 125

## 2015-04-20 MED ORDER — FLEET ENEMA 7-19 GM/118ML RE ENEM: 1.0000 | ENEMA | RECTAL | Status: DC | PRN

## 2015-04-20 MED ORDER — LACTATED RINGERS IV SOLN
INTRAVENOUS | Status: DC
  Administered 2015-04-20: 500 mL/h via INTRAVENOUS
  Administered 2015-04-20: 11:00:00 via INTRAVENOUS
  Administered 2015-04-20: 500 mL/h via INTRAVENOUS
  Administered 2015-04-20: 999 mL/h via INTRAVENOUS
  Administered 2015-04-20: 02:00:00 via INTRAVENOUS

## 2015-04-20 MED ORDER — FENTANYL CITRATE (PF) 100 MCG/2ML IJ SOLN: 50.0000 ug | INTRAMUSCULAR | Status: DC | PRN

## 2015-04-20 MED ORDER — PRENATAL MULTIVITAMIN CH
1.0000 | ORAL_TABLET | Freq: Every day | ORAL | Status: DC
  Administered 2015-04-21 – 2015-04-22 (×2): 1 via ORAL
  Filled 2015-04-20 (×2): qty 1

## 2015-04-20 MED ORDER — TETANUS-DIPHTH-ACELL PERTUSSIS 5-2.5-18.5 LF-MCG/0.5 IM SUSP
0.5000 mL | Freq: Once | INTRAMUSCULAR | Status: AC
  Administered 2015-04-21: 0.5 mL via INTRAMUSCULAR
  Filled 2015-04-20: qty 0.5

## 2015-04-20 MED ORDER — TERBUTALINE SULFATE 1 MG/ML IJ SOLN
0.2500 mg | Freq: Once | INTRAMUSCULAR | Status: DC | PRN
Start: 2015-04-20 — End: 2015-04-20
  Filled 2015-04-20: qty 1

## 2015-04-20 MED ORDER — LACTATED RINGERS IV SOLN: INTRAVENOUS | Status: DC

## 2015-04-20 MED ORDER — LANOLIN HYDROUS EX OINT: TOPICAL_OINTMENT | CUTANEOUS | Status: DC | PRN

## 2015-04-20 MED ORDER — EPHEDRINE 5 MG/ML INJ
10.0000 mg | INTRAVENOUS | Status: DC | PRN
  Filled 2015-04-20: qty 2

## 2015-04-20 MED ORDER — WITCH HAZEL-GLYCERIN EX PADS: 1.0000 "application " | MEDICATED_PAD | CUTANEOUS | Status: DC | PRN

## 2015-04-20 MED ORDER — BENZOCAINE-MENTHOL 20-0.5 % EX AERO: 1.0000 "application " | INHALATION_SPRAY | CUTANEOUS | Status: DC | PRN

## 2015-04-20 MED ORDER — TERBUTALINE SULFATE 1 MG/ML IJ SOLN
0.2500 mg | Freq: Once | INTRAMUSCULAR | Status: DC | PRN
  Filled 2015-04-20: qty 1

## 2015-04-20 MED ORDER — LIDOCAINE HCL (PF) 1 % IJ SOLN
INTRAMUSCULAR | Status: DC | PRN
  Administered 2015-04-20: 6 mL via EPIDURAL
  Administered 2015-04-20: 4 mL

## 2015-04-20 MED ORDER — DIPHENHYDRAMINE HCL 25 MG PO CAPS
25.0000 mg | ORAL_CAPSULE | Freq: Four times a day (QID) | ORAL | Status: DC | PRN
Start: 2015-04-20 — End: 2015-04-22

## 2015-04-20 MED ORDER — SENNOSIDES-DOCUSATE SODIUM 8.6-50 MG PO TABS
2.0000 | ORAL_TABLET | ORAL | Status: DC
  Administered 2015-04-20 – 2015-04-22 (×2): 2 via ORAL
  Filled 2015-04-20 (×2): qty 2

## 2015-04-20 MED ORDER — SIMETHICONE 80 MG PO CHEW: 80.0000 mg | CHEWABLE_TABLET | ORAL | Status: DC | PRN

## 2015-04-20 NOTE — H&P (Signed)
This is Dr. Gracy Racer the dictating the history and physical on  Beth Park  she's a 35 year old gravida 4 para 2012 EDC 04/15/2015 she's 40 weeks and 5 days was brought in for induction negative GBS she is on low-dose Pitocin her cervix is 2 cm vertex -2-3 amniotomy performed and the fluids clear Past medical history negative Past surgical history negative Social history negative System review negative Physical exam well-developed female in no distress HEENT negative Lungs clear to P&A Heart regular rhythm no murmurs no gallops Breasts negative Abdomen term Pelvic as described above Extremities negative

## 2015-04-20 NOTE — Anesthesia Preprocedure Evaluation (Signed)
Anesthesia Evaluation  Patient identified by MRN, date of birth, ID band Patient awake    Reviewed: Allergy & Precautions, H&P , Patient's Chart, lab work & pertinent test results  Airway Mallampati: II  TM Distance: >3 FB Neck ROM: full    Dental  (+) Teeth Intact   Pulmonary Current Smoker,    breath sounds clear to auscultation       Cardiovascular hypertension,  Rhythm:regular Rate:Normal     Neuro/Psych    GI/Hepatic   Endo/Other    Renal/GU      Musculoskeletal   Abdominal   Peds  Hematology   Anesthesia Other Findings PIH previous pregnancy; None now      Reproductive/Obstetrics (+) Pregnancy                             Anesthesia Physical Anesthesia Plan  ASA: II  Anesthesia Plan: Epidural   Post-op Pain Management:    Induction:   Airway Management Planned:   Additional Equipment:   Intra-op Plan:   Post-operative Plan:   Informed Consent: I have reviewed the patients History and Physical, chart, labs and discussed the procedure including the risks, benefits and alternatives for the proposed anesthesia with the patient or authorized representative who has indicated his/her understanding and acceptance.   Dental Advisory Given  Plan Discussed with:   Anesthesia Plan Comments: (Labs checked- platelets confirmed with RN in room. Fetal heart tracing, per RN, reported to be stable enough for sitting procedure. Discussed epidural, and patient consents to the procedure:  included risk of possible headache,backache, failed block, allergic reaction, and nerve injury. This patient was asked if she had any questions or concerns before the procedure started.)        Anesthesia Quick Evaluation

## 2015-04-20 NOTE — Anesthesia Procedure Notes (Signed)

## 2015-04-20 NOTE — Anesthesia Postprocedure Evaluation (Signed)
Anesthesia Post Note  Patient: Chapman Fitch  Procedure(s) Performed: * No procedures listed *  Patient location during evaluation: Mother Baby Anesthesia Type: Epidural Level of consciousness: awake and alert and oriented Pain management: satisfactory to patient Vital Signs Assessment: post-procedure vital signs reviewed and stable Respiratory status: spontaneous breathing and nonlabored ventilation Cardiovascular status: stable Postop Assessment: no headache, no backache, no signs of nausea or vomiting, adequate PO intake and patient able to bend at knees (patient up walking) Anesthetic complications: no    Last Vitals:  Filed Vitals:   04/20/15 1540 04/20/15 1645  BP: 131/83 134/89  Pulse: 89 85  Temp: 36.6 C 36.8 C  Resp: 18 18    Last Pain:  Filed Vitals:   04/20/15 1652  PainSc: 0-No pain                 Brittania Sudbeck

## 2015-04-21 LAB — CBC
HEMATOCRIT: 33 % — AB (ref 36.0–46.0)
HEMOGLOBIN: 11 g/dL — AB (ref 12.0–15.0)
MCH: 28.4 pg (ref 26.0–34.0)
MCHC: 33.3 g/dL (ref 30.0–36.0)
MCV: 85.1 fL (ref 78.0–100.0)
Platelets: 189 10*3/uL (ref 150–400)
RBC: 3.88 MIL/uL (ref 3.87–5.11)
RDW: 13.6 % (ref 11.5–15.5)
WBC: 16.2 10*3/uL — ABNORMAL HIGH (ref 4.0–10.5)

## 2015-04-21 NOTE — Progress Notes (Signed)
UR chart review completed.  

## 2015-04-21 NOTE — Lactation Note (Signed)
This note was copied from the chart of [place]. Lactation Consultation Note Went into Room earlier in the night, FOB was holding baby in recliner watching TV, whispered mom sleeping, and I could see she was. I returned back to room several hours later, FOB holding baby and mom wasn't in room. Had gone walking. He said mom was getting discouraged w/BF. He said the nurse told her to call for assistance for feedings but she never did. Asked him to have her call for BF assistance for next feeding. RN can help her. LC will be around again today.  Patient Name: Beth Park [id number] Date: 04/21/2015     Maternal Data    Feeding Feeding Type: Breast Fed Length of feed: 20 min  LATCH Score/Interventions                      Lactation Tools Discussed/Used     Consult Status      [name] G 04/21/2015, 6:32 AM

## 2015-04-21 NOTE — Lactation Note (Signed)
This note was copied from the chart of [place]. Lactation Consultation Note  Mom latching baby when I came into room. After a few attempts baby latched deeply and mom comfortable.  Instructed on using good breast massage and compression to increase milk flow during feeding.  Mom states baby is sucking on his fingers and hands a lot.  She was considering a pacifier.  Explained to her these are signs of hunger and baby is cluster feeding.  Encouraged to call for concerns/assist prn.  Patient Name: Beth Park [id number] Date: 04/21/2015 Reason for consult: Follow-up assessment   Maternal Data    Feeding Feeding Type: Breast Fed  LATCH Score/Interventions Latch: Grasps breast easily, tongue down, lips flanged, rhythmical sucking.  Audible Swallowing: A few with stimulation  Type of Nipple: Everted at rest and after stimulation  Comfort (Breast/Nipple): Soft / non-tender     Hold (Positioning): No assistance needed to correctly position infant at breast. Intervention(s): Breastfeeding basics reviewed  LATCH Score: 9  Lactation Tools Discussed/Used     Consult Status Consult Status: Follow-up Date: 04/22/15 Follow-up type: In-patient    Ave Filter 04/21/2015, 2:08 PM

## 2015-04-21 NOTE — Progress Notes (Signed)
Patient ID: Beth Park, female   DOB: 1980/05/22, 35 y.o.   MRN: [id number] Postpartum day one Blood pressure 127/78 pulse 59 respiration 18 Fundus firm Lochia moderate Legs negative doing well

## 2015-04-22 NOTE — Discharge Summary (Signed)
Obstetric Discharge Summary Reason for Admission: induction of labor Prenatal Procedures: none Intrapartum Procedures: spontaneous vaginal delivery Postpartum Procedures: none Complications-Operative and Postpartum: none HEMOGLOBIN  Date Value Ref Range Status  04/21/2015 11.0* 12.0 - 15.0 g/dL Final   HCT  Date Value Ref Range Status  04/21/2015 33.0* 36.0 - 46.0 % Final    Physical Exam:  General: alert Lochia: appropriate Uterine Fundus: firm Incision: healing well DVT Evaluation: No evidence of DVT seen on physical exam.  Discharge Diagnoses: Term Pregnancy-delivered  Discharge Information: Date: 04/22/2015 Activity: pelvic rest Diet: routine Medications: Percocet Condition: stable Instructions: refer to practice specific booklet Discharge to: home Follow-up Information    Follow up with Frederico Hamman, MD.   Specialty:  Obstetrics and Gynecology   Contact information:   [address] [place] [address] [phone]       Newborn Data: Live born female  Birth Weight: 7 lb 14 oz (3572 g) APGAR: 8, 9  Home with mother.  Maninder Deboer A 04/22/2015, 6:06 AM

## 2015-04-22 NOTE — Discharge Instructions (Signed)
Discharge instructions   You can wash your hair  Shower  Eat what you want  Drink what you want  See me in 6 weeks  Your ankles are going to swell more in the next 2 weeks than when pregnant  No sex for 6 weeks   Townsend Cudworth A, MD 04/22/2015

## 2015-04-22 NOTE — Lactation Note (Signed)
This note was copied from the chart of [place]. Lactation Consultation Note  Patient Name: Beth Park [id number] Date: 04/22/2015 Reason for consult: Follow-up assessment Mom reports some mild nipple tenderness, no breakdown noted. Assisted Mom with positioning and obtaining good depth with latch. Demonstrated how to bring bottom lip down with nursing for more depth/comfort. Advised baby should be at the breast 8-12 times and with feeding ques in 24 hours, nursing 15-20 minutes both breasts when possible. Risk of early pacifier use discussed, Mom using pacifier at this visit. Engorgement care reviewed if needed, advised of OP services and support group. Encouraged to call for questions/concerns. Advised to apply EBM for nipple tenderness.   Maternal Data    Feeding Feeding Type: Breast Fed  LATCH Score/Interventions Latch: Grasps breast easily, tongue down, lips flanged, rhythmical sucking. Intervention(s): Adjust position;Assist with latch;Breast massage  Audible Swallowing: Spontaneous and intermittent  Type of Nipple: Everted at rest and after stimulation  Comfort (Breast/Nipple): Filling, red/small blisters or bruises, mild/mod discomfort  Problem noted: Mild/Moderate discomfort Interventions (Mild/moderate discomfort): Hand massage;Hand expression (EBM for nipple tenderness)  Hold (Positioning): Assistance needed to correctly position infant at breast and maintain latch. Intervention(s): Breastfeeding basics reviewed;Support Pillows;Position options;Skin to skin  LATCH Score: 8  Lactation Tools Discussed/Used     Consult Status Consult Status: Complete Date: 04/22/15 Follow-up type: In-patient    Katrine Coho 04/22/2015, 11:40 AM

## 2015-04-22 NOTE — Progress Notes (Signed)
Patient ID: Beth Park, female   DOB: 05-12-1980, 35 y.o.   MRN: [id number] Postpartum day 2 Blood pressure 111/76 afebrile pulse 89 respiration 18 Fundus firm Lochia moderate Legs negative negative doing well home today

## 2015-06-23 ENCOUNTER — Encounter (HOSPITAL_COMMUNITY): Payer: Self-pay | Admitting: Emergency Medicine

## 2015-06-23 ENCOUNTER — Emergency Department (HOSPITAL_COMMUNITY)
Admission: EM | Admit: 2015-06-23 | Discharge: 2015-06-23 | Disposition: A | Discharge status: Home / Self Care | Payer: Medicaid Other | Attending: Emergency Medicine | Admitting: Emergency Medicine

## 2015-06-23 DIAGNOSIS — L0390 Cellulitis, unspecified: Secondary | ICD-10-CM

## 2015-06-23 DIAGNOSIS — M79662 Pain in left lower leg: Secondary | ICD-10-CM | POA: Diagnosis present

## 2015-06-23 DIAGNOSIS — F17210 Nicotine dependence, cigarettes, uncomplicated: Secondary | ICD-10-CM | POA: Diagnosis not present

## 2015-06-23 DIAGNOSIS — L03116 Cellulitis of left lower limb: Secondary | ICD-10-CM | POA: Diagnosis not present

## 2015-06-23 DIAGNOSIS — L0291 Cutaneous abscess, unspecified: Secondary | ICD-10-CM

## 2015-06-23 DIAGNOSIS — L02416 Cutaneous abscess of left lower limb: Secondary | ICD-10-CM | POA: Diagnosis not present

## 2015-06-23 MED ORDER — SULFAMETHOXAZOLE-TRIMETHOPRIM 800-160 MG PO TABS: 1.0000 | ORAL_TABLET | Freq: Two times a day (BID) | ORAL | Status: AC

## 2015-06-23 MED ORDER — CEPHALEXIN 500 MG PO CAPS: 500.0000 mg | ORAL_CAPSULE | Freq: Three times a day (TID) | ORAL | Status: DC

## 2015-06-23 MED ORDER — NAPROXEN 375 MG PO TABS: 375.0000 mg | ORAL_TABLET | Freq: Two times a day (BID) | ORAL | Status: DC

## 2015-06-23 NOTE — Discharge Instructions (Signed)
Abscess °An abscess is an infected area that contains a collection of pus and debris. It can occur in almost any part of the body. An abscess is also known as a furuncle or boil. °CAUSES  °An abscess occurs when tissue gets infected. This can occur from blockage of oil or sweat glands, infection of hair follicles, or a minor injury to the skin. As the body tries to fight the infection, pus collects in the area and creates pressure under the skin. This pressure causes pain. People with weakened immune systems have difficulty fighting infections and get certain abscesses more often.  °SYMPTOMS °Usually an abscess develops on the skin and becomes a painful mass that is red, warm, and tender. If the abscess forms under the skin, you may feel a moveable soft area under the skin. Some abscesses break open (rupture) on their own, but most will continue to get worse without care. The infection can spread deeper into the body and eventually into the bloodstream, causing you to feel ill.  °DIAGNOSIS  °Your caregiver will take your medical history and perform a physical exam. A sample of fluid may also be taken from the abscess to determine what is causing your infection. °TREATMENT  °Your caregiver may prescribe antibiotic medicines to fight the infection. However, taking antibiotics alone usually does not cure an abscess. Your caregiver may need to make a small cut (incision) in the abscess to drain the pus. In some cases, gauze is packed into the abscess to reduce pain and to continue draining the area. °HOME CARE INSTRUCTIONS  °· Only take over-the-counter or prescription medicines for pain, discomfort, or fever as directed by your caregiver. °· If you were prescribed antibiotics, take them as directed. Finish them even if you start to feel better. °· If gauze is used, follow your caregiver's directions for changing the gauze. °· To avoid spreading the infection: °· Keep your draining abscess covered with a  bandage. °· Wash your hands well. °· Do not share personal care items, towels, or whirlpools with others. °· Avoid skin contact with others. °· Keep your skin and clothes clean around the abscess. °· Keep all follow-up appointments as directed by your caregiver. °SEEK MEDICAL CARE IF:  °· You have increased pain, swelling, redness, fluid drainage, or bleeding. °· You have muscle aches, chills, or a general ill feeling. °· You have a fever. °MAKE SURE YOU:  °· Understand these instructions. °· Will watch your condition. °· Will get help right away if you are not doing well or get worse. °  °This information is not intended to replace advice given to you by your health care provider. Make sure you discuss any questions you have with your health care provider. °  °Document Released: 12/22/2004 Document Revised: 09/13/2011 Document Reviewed: 05/27/2011 °Elsevier Interactive Patient Education ©2016 Elsevier Inc. ° °Incision and Drainage °Incision and drainage is a procedure in which a sac-like structure (cystic structure) is opened and drained. The area to be drained usually contains material such as pus, fluid, or blood.  °LET YOUR CAREGIVER KNOW ABOUT:  °· Allergies to medicine. °· Medicines taken, including vitamins, herbs, eyedrops, over-the-counter medicines, and creams. °· Use of steroids (by mouth or creams). °· Previous problems with anesthetics or numbing medicines. °· History of bleeding problems or blood clots. °· Previous surgery. °· Other health problems, including diabetes and kidney problems. °· Possibility of pregnancy, if this applies. °RISKS AND COMPLICATIONS °· Pain. °· Bleeding. °· Scarring. °· Infection. °BEFORE THE PROCEDURE  °  You may need to have an ultrasound or other imaging tests to see how large or deep your cystic structure is. Blood tests may also be used to determine if you have an infection or how severe the infection is. You may need to have a tetanus shot. °PROCEDURE  °The affected area  is cleaned with a cleaning fluid. The cyst area will then be numbed with a medicine (local anesthetic). A small incision will be made in the cystic structure. A syringe or catheter may be used to drain the contents of the cystic structure, or the contents may be squeezed out. The area will then be flushed with a cleansing solution. After cleansing the area, it is often gently packed with a gauze or another wound dressing. Once it is packed, it will be covered with gauze and tape or some other type of wound dressing.  °AFTER THE PROCEDURE  °· Often, you will be allowed to go home right after the procedure. °· You may be given antibiotic medicine to prevent or heal an infection. °· If the area was packed with gauze or some other wound dressing, you will likely need to come back in 1 to 2 days to get it removed. °· The area should heal in about 14 days. °  °This information is not intended to replace advice given to you by your health care provider. Make sure you discuss any questions you have with your health care provider. °  °Document Released: 09/07/2000 Document Revised: 09/13/2011 Document Reviewed: 05/09/2011 °Elsevier Interactive Patient Education ©2016 Elsevier Inc. ° °

## 2015-06-23 NOTE — ED Provider Notes (Signed)
CSN: [id number]     Arrival date & time 06/23/15  1228 History  By signing my name below, I, Soijett Blue, attest that this documentation has been prepared under the direction and in the presence of Margarita Mail, PA-C Electronically Signed: [place], ED Scribe. 06/23/2015. 2:35 PM.   Chief Complaint  Patient presents with  . Leg Pain      The history is provided by the patient. No language interpreter was used.    Beth Park is a 35 y.o. female who presents to the Emergency Department complaining of left lower leg pain onset yesterday. Pt reports that the area began with a sore feeling and gradually increased yesterday. Pt notes that she is having painful ambulation due to the bump on her left upper leg. Pt is having associated symptoms of color change and abscess to left lower leg. She notes that she has not tried any medications for the relief of her symptoms. She denies fever, drainage and any other symptoms.    Past Medical History  Diagnosis Date  . Pregnancy induced hypertension    Past Surgical History  Procedure Laterality Date  . Dilation and curettage of uterus    . Foot surgery Left    No family history on file. Social History  Substance Use Topics  . Smoking status: Current Every Day Smoker -- 0.25 packs/day    Types: Cigarettes  . Smokeless tobacco: None  . Alcohol Use: No   OB History    Gravida Para Term Preterm AB TAB SAB Ectopic Multiple Living   4 3 3  1  1   0 1     Review of Systems  Constitutional: Negative for fever.  Musculoskeletal: Positive for arthralgias. Negative for joint swelling.  Skin: Positive for color change. Negative for rash.       Abscess to left lower leg without any drainage    Allergies  Review of patient's allergies indicates no known allergies.  Home Medications   Prior to Admission medications   Medication Sig Start Date End Date Taking? Authorizing Provider  cephALEXin (KEFLEX) 500 MG capsule Take 1 capsule  (500 mg total) by mouth 3 (three) times daily. 06/23/15   Margarita Mail, PA-C  naproxen (NAPROSYN) 375 MG tablet Take 1 tablet (375 mg total) by mouth 2 (two) times daily with a meal. 06/23/15   Margarita Mail, PA-C  sulfamethoxazole-trimethoprim (BACTRIM DS,SEPTRA DS) 800-160 MG tablet Take 1 tablet by mouth 2 (two) times daily. 06/23/15 06/30/15  Trek Kimball, PA-C   BP 135/94 mmHg  Pulse 100  Temp(Src) 98.9 F (37.2 C) (Oral)  Resp 16  SpO2 97% Physical Exam Physical Exam  Constitutional: Pt is oriented to person, place, and time. Pt appears well-developed and well-nourished. No distress.  HENT:  Head: Normocephalic and atraumatic.  Eyes: Conjunctivae are normal. No scleral icterus.  Neck: Normal range of motion.  Cardiovascular: Normal rate, regular rhythm and intact distal pulses.   Pulmonary/Chest: Effort normal and breath sounds normal.  Abdominal: Soft. Pt exhibits no distension. There is no tenderness.  Lymphadenopathy:    Pt has no cervical adenopathy.  Neurological: Pt is alert and oriented to person, place, and time.  Skin: 1 cm purulent bola with surrounding cellulites and induration, exquisitely tender. No streaking. Minimal discharge. Skin is warm and dry. Pt is not diaphoretic. There is erythema.  Psychiatric: Pt has a normal mood and affect.  Nursing note and vitals reviewed.   ED Course  Procedures (including critical care time)  DIAGNOSTIC STUDIES: Oxygen Saturation is 97% on RA, nl by my interpretation.    COORDINATION OF CARE: 2:28 PM Discussed treatment plan with pt at bedside which includes I&D, bactrim Rx, and keflex Rx and pt agreed to plan.  INCISION AND DRAINAGE PROCEDURE NOTE: Patient identification was confirmed and verbal consent was obtained. This procedure was performed by Margarita Mail, PA-C at 2:26 PM. Site: Left lower leg Sterile procedures observed: Yes Needle size:  Anesthetic used (type and amt): 2% Lidocaine with epinephrine and 1 ml  used Blade size: 11 Drainage: minimal Complexity: Complex Packing used: No Site anesthetized, incision made over site, wound drained and explored loculations, rinsed with copious amounts of normal saline, covered with dry, sterile dressing.  Pt tolerated procedure well without complications.  Instructions for care discussed verbally and pt provided with additional written instructions for homecare and f/u.   Labs Review Labs Reviewed  CULTURE, ROUTINE-ABSCESS    Imaging Review No results found. I have personally reviewed and evaluated these lab results as part of my medical decision-making.   EKG Interpretation None      MDM   Final diagnoses:  Abscess and cellulitis    Patient presentation consistent with abscess and cellulitis. Afebrile. No tachycardia, hypotension or other symptoms suggestive of severe infection. Area has been demarcated and pt advised to follow up for wound check in 2-3 days, sooner for worsening systemic symptoms, new lymphangitis, or significant spread of erythema past line of demarcation. Will discharge with bactrim Rx and keflex Rx. Return precautions discussed. Pt appears safe for discharge.    I personally performed the services described in this documentation, which was scribed in my presence. The recorded information has been reviewed and is accurate.       Margarita Mail, PA-C 06/23/15 1447  Charlesetta Shanks, MD 06/24/15 [phone]

## 2015-06-23 NOTE — ED Notes (Signed)
Has a bump wit5h pus on top on left leg since yesterday hurts to walk

## 2015-06-26 LAB — CULTURE, ROUTINE-ABSCESS

## 2015-06-27 ENCOUNTER — Telehealth (HOSPITAL_BASED_OUTPATIENT_CLINIC_OR_DEPARTMENT_OTHER): Payer: Self-pay | Admitting: Emergency Medicine

## 2015-06-27 NOTE — Telephone Encounter (Signed)
Post ED Visit - Positive Culture Follow-up  Culture report reviewed by antimicrobial stewardship pharmacist:  []  Elenor Quinones, Pharm.D. []  Heide Guile, Pharm.D., BCPS []  Parks Neptune, Pharm.D. []  Alycia Rossetti, Pharm.D., BCPS []  [place], Pharm.D., BCPS, AAHIVP []  Legrand Como, Pharm.D., BCPS, AAHIVP []  Milus Glazier, Pharm.D. []  Stephens November, [place].D. Salome Arnt PharmD  Positive abcess culture Treated with cephalexin and bactrim DS, organism sensitive to the same and no further patient follow-up is required at this time.  Hazle Nordmann 06/27/2015, 3:06 PM

## 2015-06-27 NOTE — Telephone Encounter (Deleted)
Admitting diagnosis:  Pertinent hx:  Living situation:  Diet tolerance:  Foley:  DVT:  Mobility:  Tele:  Skin:  Abnormal Labs:  Issues that need to be addressed:  Consults needed or recommended:  Pending procedures:Post ED Visit - Positive Culture Follow-up: Chart Hand-off to ED Flow Manager  Culture assessed and recommendations reviewed by: []  Levester Fresh, Pharm.D., BCPS []  Heide Guile, Pharm.D., BCPS-AQ ID []  Alycia Rossetti, Pharm.D., BCPS []  [place], Pharm.D., BCPS, AAHIVP []  Legrand Como, Pharm .D., BCPS, AAHIVP []  Milus Glazier, Pharm.D. []  Dimitri Ped, Pharm.D.  Positive *** culture  []  Patient discharged without antimicrobial prescription and treatment is now indicated []  Organism is resistant to prescribed ED discharge antimicrobial []  Patient with positive blood cultures  Changes discussed with ED provider: *** New antibiotic prescription ***   Hazle Nordmann 06/27/2015, 3:03 PM   Bowel Movement:  IV/Drips/Abx:  Disposition:

## 2015-12-20 ENCOUNTER — Encounter: Payer: Self-pay | Admitting: *Deleted

## 2016-03-28 NOTE — L&D Delivery Note (Signed)
Delivery Note At  a viable female was delivered via  (Presentation:vertex ;  LOA).  APGAR:9 ,9 ; weight  .   Placenta status: spont, shultz.  Cord:3vc  with the following complications:none .  Cord pH: n/2  Anesthesia:  none Episiotomy:  none  Lacerations:  none Suture Repair: none Est. Blood Loss 150(mL):    Mom to postpartum.  Baby to Couplet care / Skin to Skin.  Koren Shiver 03/12/2017, 4:02 PM

## 2016-08-28 ENCOUNTER — Inpatient Hospital Stay (HOSPITAL_COMMUNITY): Payer: Medicaid Other

## 2016-08-28 ENCOUNTER — Encounter (HOSPITAL_COMMUNITY): Payer: Self-pay | Admitting: *Deleted

## 2016-08-28 ENCOUNTER — Inpatient Hospital Stay (HOSPITAL_COMMUNITY)
Admission: AD | Admit: 2016-08-28 | Discharge: 2016-08-28 | Disposition: A | Discharge status: Home / Self Care | Payer: Medicaid Other | Source: Ambulatory Visit | Attending: Obstetrics and Gynecology | Admitting: Obstetrics and Gynecology

## 2016-08-28 DIAGNOSIS — F17210 Nicotine dependence, cigarettes, uncomplicated: Secondary | ICD-10-CM | POA: Diagnosis not present

## 2016-08-28 DIAGNOSIS — O3411 Maternal care for benign tumor of corpus uteri, first trimester: Secondary | ICD-10-CM | POA: Diagnosis not present

## 2016-08-28 DIAGNOSIS — O99331 Smoking (tobacco) complicating pregnancy, first trimester: Secondary | ICD-10-CM | POA: Insufficient documentation

## 2016-08-28 DIAGNOSIS — D259 Leiomyoma of uterus, unspecified: Secondary | ICD-10-CM | POA: Insufficient documentation

## 2016-08-28 DIAGNOSIS — R109 Unspecified abdominal pain: Secondary | ICD-10-CM | POA: Diagnosis present

## 2016-08-28 DIAGNOSIS — O9989 Other specified diseases and conditions complicating pregnancy, childbirth and the puerperium: Secondary | ICD-10-CM

## 2016-08-28 DIAGNOSIS — O219 Vomiting of pregnancy, unspecified: Secondary | ICD-10-CM

## 2016-08-28 DIAGNOSIS — Z3A10 10 weeks gestation of pregnancy: Secondary | ICD-10-CM | POA: Diagnosis not present

## 2016-08-28 DIAGNOSIS — O26891 Other specified pregnancy related conditions, first trimester: Secondary | ICD-10-CM

## 2016-08-28 DIAGNOSIS — Z3491 Encounter for supervision of normal pregnancy, unspecified, first trimester: Secondary | ICD-10-CM

## 2016-08-28 LAB — POCT PREGNANCY, URINE
PREG TEST UR: POSITIVE — AB
Preg Test, Ur: POSITIVE — AB

## 2016-08-28 LAB — WET PREP, GENITAL
SPERM: NONE SEEN
Trich, Wet Prep: NONE SEEN
YEAST WET PREP: NONE SEEN

## 2016-08-28 LAB — URINALYSIS, ROUTINE W REFLEX MICROSCOPIC
BILIRUBIN URINE: NEGATIVE
Glucose, UA: NEGATIVE mg/dL
HGB URINE DIPSTICK: NEGATIVE
Ketones, ur: NEGATIVE mg/dL
Leukocytes, UA: NEGATIVE
Nitrite: NEGATIVE
PH: 5 (ref 5.0–8.0)
Protein, ur: NEGATIVE mg/dL
SPECIFIC GRAVITY, URINE: 1.028 (ref 1.005–1.030)

## 2016-08-28 LAB — CBC
HCT: 34.8 % — ABNORMAL LOW (ref 36.0–46.0)
HEMOGLOBIN: 12 g/dL (ref 12.0–15.0)
MCH: 28.8 pg (ref 26.0–34.0)
MCHC: 34.5 g/dL (ref 30.0–36.0)
MCV: 83.5 fL (ref 78.0–100.0)
Platelets: 282 10*3/uL (ref 150–400)
RBC: 4.17 MIL/uL (ref 3.87–5.11)
RDW: 13.5 % (ref 11.5–15.5)
WBC: 11.6 10*3/uL — ABNORMAL HIGH (ref 4.0–10.5)

## 2016-08-28 LAB — HCG, QUANTITATIVE, PREGNANCY: HCG, BETA CHAIN, QUANT, S: 111581 m[IU]/mL — AB (ref <5)

## 2016-08-28 MED ORDER — PROMETHAZINE HCL 25 MG PO TABS: 25.0000 mg | ORAL_TABLET | Freq: Four times a day (QID) | ORAL | 0 refills | Status: DC | PRN

## 2016-08-28 NOTE — MAU Note (Signed)
Pain in lower abd started a couple wks ago, had it off and on for a couple days, then started again. +HPT a couple wks ago.  Been working 10hr days, is tired

## 2016-08-28 NOTE — MAU Provider Note (Signed)
History     CSN: [id number]  Arrival date and time: 08/28/16 5621   First Provider Initiated Contact with Patient 08/28/16 1858      Chief Complaint  Patient presents with  . Abdominal Pain   HPI Beth Park 36 y.o. [redacted]w[redacted]d based on uncertain LMP of 06-03-63.  Youngest child is 18 months.  Having periodic lower abdominal tenderness and feels very tired.  No bleeding, No vaginal discharge.  Has not been seen for prenatal care.  Works 10 hour shifts for 5 days per week.  OB History    Gravida Para Term Preterm AB Living   5 3 3   1 3    SAB TAB Ectopic Multiple Live Births   1     0 3      Past Medical History:  Diagnosis Date  . Pregnancy induced hypertension     Past Surgical History:  Procedure Laterality Date  . DILATION AND CURETTAGE OF UTERUS    . FOOT SURGERY Left     No family history on file.  Social History  Substance Use Topics  . Smoking status: Current Every Day Smoker    Packs/day: 0.25    Types: Cigarettes  . Smokeless tobacco: Not on file  . Alcohol use No    Allergies: No Known Allergies  Prescriptions Prior to Admission  Medication Sig Dispense Refill Last Dose  . cephALEXin (KEFLEX) 500 MG capsule Take 1 capsule (500 mg total) by mouth 3 (three) times daily. 42 capsule 0   . naproxen (NAPROSYN) 375 MG tablet Take 1 tablet (375 mg total) by mouth 2 (two) times daily with a meal. 20 tablet 0     Review of Systems  Constitutional: Negative for fever.  Gastrointestinal: Positive for abdominal pain, nausea and vomiting.       Abdominal pain comes and goes - sharp  Genitourinary: Negative for vaginal bleeding and vaginal discharge.   Physical Exam   Blood pressure 127/78, pulse 97, temperature 98.7 F (37.1 C), temperature source Oral, resp. rate 18, weight 235 lb 4 oz (106.7 kg), last menstrual period 06/14/2016, SpO2 99 %, unknown if currently breastfeeding.  Physical Exam  Nursing note and vitals reviewed. Constitutional: She is  oriented to person, place, and time. She appears well-developed and well-nourished.  HENT:  Head: Normocephalic.  Eyes: EOM are normal.  Neck: Neck supple.  GI: Soft. There is no tenderness.  Unable to hear FHT with doppler.  Genitourinary:  Genitourinary Comments: Speculum exam: Vagina - Minimal white discharge, no odor Cervix - No contact bleeding Bimanual exam: Cervix closed Uterus non tende and enlarged Adnexa non tender, no masses bilaterally GC/Chlam, wet prep done Chaperone present for exam.   Musculoskeletal: Normal range of motion.  Neurological: She is alert and oriented to person, place, and time.  Skin: Skin is warm and dry.  Psychiatric: She has a normal mood and affect.    MAU Course  Procedures Results for orders placed or performed during the hospital encounter of 08/28/16 (from the past 24 hour(s))  Urinalysis, Routine w reflex microscopic     Status: None   Collection Time: 08/28/16  6:30 PM  Result Value Ref Range   Color, Urine YELLOW YELLOW   APPearance CLEAR CLEAR   Specific Gravity, Urine 1.028 1.005 - 1.030   pH 5.0 5.0 - 8.0   Glucose, UA NEGATIVE NEGATIVE mg/dL   Hgb urine dipstick NEGATIVE NEGATIVE   Bilirubin Urine NEGATIVE NEGATIVE   Ketones, ur NEGATIVE NEGATIVE  mg/dL   Protein, ur NEGATIVE NEGATIVE mg/dL   Nitrite NEGATIVE NEGATIVE   Leukocytes, UA NEGATIVE NEGATIVE  Pregnancy, urine POC     Status: Abnormal   Collection Time: 08/28/16  6:36 PM  Result Value Ref Range   Preg Test, Ur POSITIVE (A) NEGATIVE  CBC     Status: Abnormal   Collection Time: 08/28/16  7:27 PM  Result Value Ref Range   WBC 11.6 (H) 4.0 - 10.5 K/uL   RBC 4.17 3.87 - 5.11 MIL/uL   Hemoglobin 12.0 12.0 - 15.0 g/dL   HCT 34.8 (L) 36.0 - 46.0 %   MCV 83.5 78.0 - 100.0 fL   MCH 28.8 26.0 - 34.0 pg   MCHC 34.5 30.0 - 36.0 g/dL   RDW 13.5 11.5 - 15.5 %   Platelets 282 150 - 400 K/uL  hCG, quantitative, pregnancy     Status: Abnormal   Collection Time: 08/28/16   7:27 PM  Result Value Ref Range   hCG, Beta Chain, Quant, S 111,581 (H) <5 mIU/mL  Wet prep, genital     Status: Abnormal   Collection Time: 08/28/16  7:30 PM  Result Value Ref Range   Yeast Wet Prep HPF POC NONE SEEN NONE SEEN   Trich, Wet Prep NONE SEEN NONE SEEN   Clue Cells Wet Prep HPF POC PRESENT (A) NONE SEEN   WBC, Wet Prep HPF POC MANY (A) NONE SEEN   Sperm NONE SEEN   Pregnancy, urine POC     Status: Abnormal   Collection Time: 08/28/16  7:40 PM  Result Value Ref Range   Preg Test, Ur POSITIVE (A) NEGATIVE   CLINICAL DATA:  Pregnant patient with abdominal pain.  EXAM: OBSTETRIC <14 WK ULTRASOUND  TECHNIQUE: Transabdominal ultrasound was performed for evaluation of the gestation as well as the maternal uterus and adnexal regions.  COMPARISON:  Pelvic ultrasound 09/05/2014  FINDINGS: Intrauterine gestational sac: Single  Yolk sac:  Visualized.  Embryo:  Visualized.  Cardiac Activity: Visualized.  Heart Rate: 130 bpm  CRL:   38  mm   10 w for d                  [place] EDC: 03/22/2017  Subchorionic hemorrhage:  None visualized.  Maternal uterus/adnexae: The left ovary is not visualized. The right ovary is normal. There is a 3.4 x 4.7 x 4.4 cm left fibroid within the left aspect of the uterine fundus.  IMPRESSION: Single live intrauterine gestation.  No acute process.  Fibroid uterus.   MDM   Assessment and Plan  IUP at [redacted]w[redacted]d Fibroid in uterus  Plan Prescribed phenergan for nausea Drink at least 8 8-oz glasses of water every day. Take Tylenol 325 mg 2 tablets by mouth every 4 hours if needed for pain. Your pregnancy test is positive.  No smoking, no drugs, no alcohol.  Take a prenatal vitamin one by mouth every day.  Eat small frequent snacks to avoid nausea.  Begin prenatal care as soon as possible.  Lauraine Crespo L Derrius Furtick 08/28/2016, 7:08 PM

## 2016-08-28 NOTE — Discharge Instructions (Signed)
Return to care   If you have heavier bleeding that soaks through more that 2 pads per hour for an hour or more  If you bleed so much that you feel like you might pass out or you do pass out  If you have significant abdominal pain that is not improved with Tylenol   If you develop a fever > 100.5     Beth Park Prenatal Care Providers   [facility]       Phone: [phone]  [facility] Phone: [phone]  [facility]   Phone: [phone]  [facility] [place]  Phone: [phone]  [facility] [place]  Phone: [phone]  [facility]       Phone: [phone]  [facility]    Phone: [phone]   [facility]    Phone: [phone]  [facility]    Phone: [phone]  [facility]    Phone: [phone]   [facility]  Phone: [phone]  [facility]    Phone: [phone]  [facility]   Phone: [phone]  [facility]    Phone: [phone]  

## 2016-08-29 LAB — HIV ANTIBODY (ROUTINE TESTING W REFLEX): HIV Screen 4th Generation wRfx: NONREACTIVE

## 2016-08-29 LAB — RPR: RPR Ser Ql: NONREACTIVE

## 2016-08-29 LAB — GC/CHLAMYDIA PROBE AMP (~~LOC~~) NOT AT ARMC
Chlamydia: NEGATIVE
NEISSERIA GONORRHEA: NEGATIVE

## 2016-10-13 ENCOUNTER — Encounter: Payer: Self-pay | Admitting: *Deleted

## 2016-10-13 ENCOUNTER — Other Ambulatory Visit (HOSPITAL_COMMUNITY)
Admission: RE | Admit: 2016-10-13 | Discharge: 2016-10-13 | Disposition: A | Discharge status: Home / Self Care | Payer: Medicaid Other | Source: Ambulatory Visit | Attending: Family Medicine | Admitting: Family Medicine

## 2016-10-13 ENCOUNTER — Encounter: Payer: Self-pay | Admitting: Radiology

## 2016-10-13 ENCOUNTER — Encounter: Payer: Self-pay | Admitting: Family Medicine

## 2016-10-13 ENCOUNTER — Ambulatory Visit (INDEPENDENT_AMBULATORY_CARE_PROVIDER_SITE_OTHER): Payer: Medicaid Other | Admitting: Family Medicine

## 2016-10-13 VITALS — BP 104/71 | HR 91 | Wt 234.0 lb

## 2016-10-13 DIAGNOSIS — Z3482 Encounter for supervision of other normal pregnancy, second trimester: Secondary | ICD-10-CM | POA: Diagnosis not present

## 2016-10-13 DIAGNOSIS — Z8759 Personal history of other complications of pregnancy, childbirth and the puerperium: Secondary | ICD-10-CM | POA: Diagnosis not present

## 2016-10-13 DIAGNOSIS — O09522 Supervision of elderly multigravida, second trimester: Secondary | ICD-10-CM

## 2016-10-13 DIAGNOSIS — Z3492 Encounter for supervision of normal pregnancy, unspecified, second trimester: Secondary | ICD-10-CM | POA: Insufficient documentation

## 2016-10-13 DIAGNOSIS — Z3490 Encounter for supervision of normal pregnancy, unspecified, unspecified trimester: Secondary | ICD-10-CM

## 2016-10-13 DIAGNOSIS — Z3A17 17 weeks gestation of pregnancy: Secondary | ICD-10-CM | POA: Insufficient documentation

## 2016-10-13 DIAGNOSIS — O09529 Supervision of elderly multigravida, unspecified trimester: Secondary | ICD-10-CM | POA: Insufficient documentation

## 2016-10-13 HISTORY — DX: Personal history of other complications of pregnancy, childbirth and the puerperium: Z87.59

## 2016-10-13 MED ORDER — ASPIRIN EC 81 MG PO TBEC: 81.0000 mg | DELAYED_RELEASE_TABLET | Freq: Every day | ORAL | 2 refills | Status: DC

## 2016-10-13 MED ORDER — PRENATAL 27-0.8 MG PO TABS: 1.0000 | ORAL_TABLET | Freq: Every day | ORAL | 3 refills | Status: DC

## 2016-10-13 NOTE — Progress Notes (Signed)
Subjective:  Beth Park is a [id number] [redacted]w[redacted]d being seen today for her first obstetrical visit.  Her obstetrical history is significant for advanced maternal age and h/o eclampsia with first pregnancy. Patient does not intend to breast feed. Pregnancy history fully reviewed.  Patient reports no complaints.  BP 104/71   Pulse 91   Wt 234 lb (106.1 kg)   LMP 06/14/2016 (Within Weeks)   BMI 36.65 kg/m   HISTORY: OB History  Gravida Para Term Preterm AB Living  5 3 3   1 3   SAB TAB Ectopic Multiple Live Births  1     0 3    # Outcome Date GA Lbr Len/2nd Weight Sex Delivery Anes PTL Lv  5 Current           4 Term 04/20/15 [redacted]w[redacted]d 03:35 / 00:49 7 lb 14 oz (3.572 kg) M Vag-Spont EPI  LIV  3 Term 03/21/07 [redacted]w[redacted]d  6 lb 5 oz (2.863 kg) M Vag-Spont   LIV  2 Term 05/27/00 [redacted]w[redacted]d  7 lb 13 oz (3.544 kg)  Vag-Spont   LIV  1 SAB             Obstetric Comments  Eclampsia 1 week postpartum following first delivery    Past Medical History:  Diagnosis Date  . Pregnancy induced hypertension     Past Surgical History:  Procedure Laterality Date  . DILATION AND CURETTAGE OF UTERUS    . FOOT SURGERY Left     Family History  Problem Relation Age of Onset  . Hypertension Mother   . Stroke Maternal Grandmother   . Cancer Maternal Grandfather      Exam    Uterus:     Pelvic Exam:    Perineum: No Hemorrhoids, Normal Perineum   Vulva: normal, Bartholin's, Urethra, Skene's normal   Vagina:  normal mucosa       Cervix: multiparous appearance and no bleeding following Pap  System: Breast:  normal appearance, no masses or tenderness, Inspection negative, No nipple retraction or dimpling, No nipple discharge or bleeding, No axillary or supraclavicular adenopathy   Skin: normal coloration and turgor, no rashes    Neurologic: oriented, gait normal; reflexes normal and symmetric   Extremities: normal strength, tone, and muscle mass, no deformities, no erythema, induration, or nodules   HEENT PERRLA and extra ocular movement intact   Mouth/Teeth mucous membranes moist, pharynx normal without lesions   Neck supple and no masses   Cardiovascular: regular rate and rhythm, no murmurs or gallops   Respiratory:  appears well, vitals normal, no respiratory distress, acyanotic, normal RR, ear and throat exam is normal, neck free of mass or lymphadenopathy, chest clear, no wheezing, crepitations, rhonchi, normal symmetric air entry   Abdomen: soft, non-tender; bowel sounds normal; no masses,  no organomegaly   Urinary: urethral meatus normal      Assessment:    Pregnancy: [id number] Patient Active Problem List   Diagnosis Date Noted  . Encounter for supervision of normal pregnancy, unspecified, unspecified trimester 10/13/2016  . History of eclampsia 10/13/2016  . Antepartum multigravida of advanced maternal age 35/19/2018      Plan:   1. Encounter for supervision of normal pregnancy, antepartum, unspecified gravidity - AFP TETRA - Culture, OB Urine - Hemoglobinopathy evaluation - Obstetric Panel, Including HIV - Cytology - PAP - Varicella zoster antibody, IgG - aspirin EC 81 MG tablet; Take 1 tablet (81 mg total) by mouth daily. Take after 12 weeks for prevention of preeclampsia  later in pregnancy  Dispense: 300 tablet; Refill: 2 - Comprehensive metabolic panel - Protein / creatinine ratio, urine - [place] MFM OB DETAIL +14 WK; Future  2. History of eclampsia - aspirin EC 81 MG tablet; Take 1 tablet (81 mg total) by mouth daily. Take after 12 weeks for prevention of preeclampsia later in pregnancy  Dispense: 300 tablet; Refill: 2 - Comprehensive metabolic panel - Protein / creatinine ratio, urine - [place] MFM OB DETAIL +14 WK; Future  3. Antepartum multigravida of advanced maternal age - [place] MFM OB DETAIL +14 WK; Future     Problem list reviewed and updated. 75% of 30 min visit spent on counseling and coordination of care.     Truett Mainland 10/13/2016

## 2016-10-17 ENCOUNTER — Encounter: Payer: Self-pay | Admitting: *Deleted

## 2016-10-17 LAB — CYTOLOGY - PAP
Chlamydia: NEGATIVE
Diagnosis: NEGATIVE
HPV (WINDOPATH): NOT DETECTED
Neisseria Gonorrhea: NEGATIVE

## 2016-10-19 ENCOUNTER — Encounter: Payer: Self-pay | Admitting: *Deleted

## 2016-10-19 LAB — AFP TETRA
DIA Mom Value: 2.49
DIA Value (EIA): 320.48 pg/mL
DSR (BY AGE) 1 IN: 256
DSR (Second Trimester) 1 IN: 332
Gestational Age: 17.3 WEEKS
MSAFP MOM: 1.62
MSAFP: 53.1 ng/mL
MSHCG MOM: 1.75
MSHCG: 39519 m[IU]/mL
Maternal Age At EDD: 35.7 yr
OSB RISK: 3989
Test Results:: NEGATIVE
Weight: 234 [lb_av]
uE3 Mom: 0.84
uE3 Value: 0.8 ng/mL

## 2016-10-19 LAB — OBSTETRIC PANEL, INCLUDING HIV
ANTIBODY SCREEN: NEGATIVE
Basophils Absolute: 0 10*3/uL (ref 0.0–0.2)
Basos: 0 %
EOS (ABSOLUTE): 0.1 10*3/uL (ref 0.0–0.4)
EOS: 1 %
HEP B S AG: NEGATIVE
HIV SCREEN 4TH GENERATION: NONREACTIVE
Hematocrit: 34 % (ref 34.0–46.6)
Hemoglobin: 11.7 g/dL (ref 11.1–15.9)
Immature Grans (Abs): 0 10*3/uL (ref 0.0–0.1)
Immature Granulocytes: 0 %
LYMPHS ABS: 2.2 10*3/uL (ref 0.7–3.1)
Lymphs: 23 %
MCH: 29.3 pg (ref 26.6–33.0)
MCHC: 34.4 g/dL (ref 31.5–35.7)
MCV: 85 fL (ref 79–97)
Monocytes Absolute: 0.4 10*3/uL (ref 0.1–0.9)
Monocytes: 4 %
NEUTROS ABS: 6.7 10*3/uL (ref 1.4–7.0)
Neutrophils: 72 %
PLATELETS: 264 10*3/uL (ref 150–379)
RBC: 4 x10E6/uL (ref 3.77–5.28)
RDW: 14 % (ref 12.3–15.4)
RPR: NONREACTIVE
Rh Factor: POSITIVE
Rubella Antibodies, IGG: 3.1 index (ref >0.99)
WBC: 9.4 10*3/uL (ref 3.4–10.8)

## 2016-10-19 LAB — VARICELLA ZOSTER ANTIBODY, IGG: Varicella zoster IgG: 3610 index (ref >165)

## 2016-10-19 LAB — COMPREHENSIVE METABOLIC PANEL
ALBUMIN: 3.7 g/dL (ref 3.5–5.5)
ALK PHOS: 52 IU/L (ref 39–117)
ALT: 10 IU/L (ref 0–32)
AST: 14 IU/L (ref 0–40)
Albumin/Globulin Ratio: 1.5 (ref 1.2–2.2)
BUN / CREAT RATIO: 18 (ref 9–23)
BUN: 10 mg/dL (ref 6–20)
Bilirubin Total: <0.2 mg/dL (ref 0.0–1.2)
CO2: 17 mmol/L — ABNORMAL LOW (ref 20–29)
Calcium: 9.2 mg/dL (ref 8.7–10.2)
Chloride: 105 mmol/L (ref 96–106)
Creatinine, Ser: 0.55 mg/dL — ABNORMAL LOW (ref 0.57–1.00)
GFR calc Af Amer: 141 mL/min/{1.73_m2} (ref >59)
GFR calc non Af Amer: 122 mL/min/{1.73_m2} (ref >59)
Globulin, Total: 2.4 g/dL (ref 1.5–4.5)
Glucose: 122 mg/dL — ABNORMAL HIGH (ref 65–99)
Potassium: 3.7 mmol/L (ref 3.5–5.2)
SODIUM: 137 mmol/L (ref 134–144)
Total Protein: 6.1 g/dL (ref 6.0–8.5)

## 2016-10-19 LAB — HEMOGLOBINOPATHY EVALUATION
HEMOGLOBIN F QUANTITATION: 0 % (ref 0.0–2.0)
HGB C: 0 %
HGB S: 0 %
HGB VARIANT: 0 %
Hemoglobin A2 Quantitation: 2.5 % (ref 1.8–3.2)
Hgb A: 97.5 % (ref 96.4–98.8)

## 2016-10-19 LAB — PROTEIN / CREATININE RATIO, URINE

## 2016-10-25 ENCOUNTER — Other Ambulatory Visit (HOSPITAL_COMMUNITY): Payer: Self-pay | Admitting: *Deleted

## 2016-10-25 ENCOUNTER — Ambulatory Visit (HOSPITAL_COMMUNITY)
Admission: RE | Admit: 2016-10-25 | Discharge: 2016-10-25 | Disposition: A | Discharge status: Home / Self Care | Payer: Medicaid Other | Source: Ambulatory Visit | Attending: Family Medicine | Admitting: Family Medicine

## 2016-10-25 ENCOUNTER — Other Ambulatory Visit: Payer: Self-pay | Admitting: Family Medicine

## 2016-10-25 ENCOUNTER — Encounter (HOSPITAL_COMMUNITY): Payer: Self-pay

## 2016-10-25 DIAGNOSIS — Z3A19 19 weeks gestation of pregnancy: Secondary | ICD-10-CM

## 2016-10-25 DIAGNOSIS — O09529 Supervision of elderly multigravida, unspecified trimester: Secondary | ICD-10-CM

## 2016-10-25 DIAGNOSIS — Z8759 Personal history of other complications of pregnancy, childbirth and the puerperium: Secondary | ICD-10-CM | POA: Diagnosis not present

## 2016-10-25 DIAGNOSIS — O09522 Supervision of elderly multigravida, second trimester: Secondary | ICD-10-CM | POA: Diagnosis present

## 2016-10-25 DIAGNOSIS — Z369 Encounter for antenatal screening, unspecified: Secondary | ICD-10-CM

## 2016-10-25 DIAGNOSIS — O99212 Obesity complicating pregnancy, second trimester: Secondary | ICD-10-CM

## 2016-10-25 DIAGNOSIS — Z3490 Encounter for supervision of normal pregnancy, unspecified, unspecified trimester: Secondary | ICD-10-CM

## 2016-10-25 DIAGNOSIS — O288 Other abnormal findings on antenatal screening of mother: Principal | ICD-10-CM

## 2016-10-25 DIAGNOSIS — O09899 Supervision of other high risk pregnancies, unspecified trimester: Secondary | ICD-10-CM

## 2016-11-10 ENCOUNTER — Encounter: Payer: Self-pay | Admitting: Obstetrics and Gynecology

## 2016-11-10 ENCOUNTER — Ambulatory Visit (INDEPENDENT_AMBULATORY_CARE_PROVIDER_SITE_OTHER): Payer: Medicaid Other | Admitting: Obstetrics and Gynecology

## 2016-11-10 ENCOUNTER — Encounter: Payer: Self-pay | Admitting: Radiology

## 2016-11-10 VITALS — BP 116/73 | HR 94 | Wt 234.0 lb

## 2016-11-10 DIAGNOSIS — Z8759 Personal history of other complications of pregnancy, childbirth and the puerperium: Secondary | ICD-10-CM

## 2016-11-10 DIAGNOSIS — Z3492 Encounter for supervision of normal pregnancy, unspecified, second trimester: Secondary | ICD-10-CM

## 2016-11-10 DIAGNOSIS — O09522 Supervision of elderly multigravida, second trimester: Secondary | ICD-10-CM

## 2016-11-10 DIAGNOSIS — O280 Abnormal hematological finding on antenatal screening of mother: Secondary | ICD-10-CM

## 2016-11-10 DIAGNOSIS — E669 Obesity, unspecified: Secondary | ICD-10-CM

## 2016-11-10 DIAGNOSIS — O99212 Obesity complicating pregnancy, second trimester: Secondary | ICD-10-CM

## 2016-11-10 DIAGNOSIS — O99210 Obesity complicating pregnancy, unspecified trimester: Secondary | ICD-10-CM | POA: Insufficient documentation

## 2016-11-10 DIAGNOSIS — Z3490 Encounter for supervision of normal pregnancy, unspecified, unspecified trimester: Secondary | ICD-10-CM

## 2016-11-10 DIAGNOSIS — O288 Other abnormal findings on antenatal screening of mother: Secondary | ICD-10-CM

## 2016-11-10 DIAGNOSIS — O09529 Supervision of elderly multigravida, unspecified trimester: Secondary | ICD-10-CM

## 2016-11-10 DIAGNOSIS — O09899 Supervision of other high risk pregnancies, unspecified trimester: Secondary | ICD-10-CM | POA: Insufficient documentation

## 2016-11-10 NOTE — Progress Notes (Signed)
Prenatal Visit Note Date: 11/10/2016 Clinic: [facility]  Subjective:  Beth Park is a 36 y.o. [phone] at [redacted]w[redacted]d being seen today for ongoing prenatal care.  She is currently monitored for the following issues for this high-risk pregnancy and has Encounter for supervision of normal pregnancy, unspecified, unspecified trimester; History of eclampsia; Antepartum multigravida of advanced maternal age; Obesity, Class II, BMI 35-39.9; Obesity in pregnancy; and Abnormal maternal serum screening test on her problem list.  Patient reports no complaints.   Contractions: Not present. Vag. Bleeding: None.  Movement: Present. Denies leaking of fluid.   The following portions of the patient's history were reviewed and updated as appropriate: allergies, current medications, past family history, past medical history, past social history, past surgical history and problem list. Problem list updated.  Objective:   Vitals:   11/10/16 0910  BP: 116/73  Pulse: 94  Weight: 234 lb (106.1 kg)    Fetal Status: Fetal Heart Rate (bpm): 147   Movement: Present     General:  Alert, oriented and cooperative. Patient is in no acute distress.  Skin: Skin is warm and dry. No rash noted.   Cardiovascular: Normal heart rate noted  Respiratory: Normal respiratory effort, no problems with respiration noted  Abdomen: Soft, gravid, appropriate for gestational age. Pain/Pressure: Absent     Pelvic:  Cervical exam deferred        Extremities: Normal range of motion.  Edema: None  Mental Status: Normal mood and affect. Normal behavior. Normal judgment and thought content.   Urinalysis:      Assessment and Plan:  Pregnancy: [id number] at [redacted]w[redacted]d  1. Encounter for supervision of normal pregnancy, antepartum, unspecified gravidity Routine care. Rpt UCx, PC ratio (unable to result last time) - Culture, OB Urine  2. AMA (advanced maternal age) multigravida 53+, second trimester PL updated - Protein /  creatinine ratio, urine  3. Abnormal maternal serum screening test Serial u/s already scheduled.   4. Obesity in pregnancy  5. Obesity, Class II, BMI 35-39.9  6. Antepartum multigravida of advanced maternal age  64. History of eclampsia Confirms baby ASA  Preterm labor symptoms and general obstetric precautions including but not limited to vaginal bleeding, contractions, leaking of fluid and fetal movement were reviewed in detail with the patient. Please refer to After Visit Summary for other counseling recommendations.  Return in about 3 weeks (around 12/01/2016) for rob.   Aletha Halim, MD

## 2016-11-11 LAB — PROTEIN / CREATININE RATIO, URINE
CREATININE, UR: 189.7 mg/dL
PROTEIN UR: 30.3 mg/dL
PROTEIN/CREAT RATIO: 160 mg/g{creat} (ref 0–200)

## 2016-11-12 LAB — URINE CULTURE, OB REFLEX

## 2016-11-12 LAB — CULTURE, OB URINE

## 2016-11-29 ENCOUNTER — Encounter: Payer: Self-pay | Admitting: Radiology

## 2016-11-29 ENCOUNTER — Ambulatory Visit (INDEPENDENT_AMBULATORY_CARE_PROVIDER_SITE_OTHER): Payer: Medicaid Other | Admitting: Obstetrics and Gynecology

## 2016-11-29 VITALS — BP 121/78 | HR 106 | Wt 241.0 lb

## 2016-11-29 DIAGNOSIS — O99210 Obesity complicating pregnancy, unspecified trimester: Secondary | ICD-10-CM

## 2016-11-29 DIAGNOSIS — E669 Obesity, unspecified: Secondary | ICD-10-CM

## 2016-11-29 DIAGNOSIS — Z8759 Personal history of other complications of pregnancy, childbirth and the puerperium: Secondary | ICD-10-CM

## 2016-11-29 DIAGNOSIS — O288 Other abnormal findings on antenatal screening of mother: Secondary | ICD-10-CM

## 2016-11-29 DIAGNOSIS — O09899 Supervision of other high risk pregnancies, unspecified trimester: Secondary | ICD-10-CM

## 2016-11-29 DIAGNOSIS — R350 Frequency of micturition: Secondary | ICD-10-CM

## 2016-11-29 DIAGNOSIS — Z3490 Encounter for supervision of normal pregnancy, unspecified, unspecified trimester: Secondary | ICD-10-CM

## 2016-11-29 DIAGNOSIS — O09529 Supervision of elderly multigravida, unspecified trimester: Secondary | ICD-10-CM

## 2016-11-29 LAB — POCT URINALYSIS DIPSTICK
BILIRUBIN UA: NEGATIVE
Glucose, UA: NEGATIVE
LEUKOCYTES UA: NEGATIVE
NITRITE UA: NEGATIVE
Spec Grav, UA: 1.015 (ref 1.010–1.025)
UROBILINOGEN UA: 0.2 U/dL
pH, UA: 6.5 (ref 5.0–8.0)

## 2016-11-29 NOTE — Progress Notes (Signed)
Prenatal Visit Note Date: 11/29/2016 Clinic: [facility]  Subjective:  Beth Park is a 36 y.o. [phone] at [redacted]w[redacted]d being seen today for ongoing prenatal care.  She is currently monitored for the following issues for this low-risk pregnancy and has Encounter for supervision of normal pregnancy, unspecified, unspecified trimester; History of eclampsia; Antepartum multigravida of advanced maternal age; Obesity, Class II, BMI 35-39.9; Obesity in pregnancy; and Abnormal maternal serum screening test on her problem list.  Patient reports no complaints.   Contractions: Not present. Vag. Bleeding: None.  Movement: Present. Denies leaking of fluid.   The following portions of the patient's history were reviewed and updated as appropriate: allergies, current medications, past family history, past medical history, past social history, past surgical history and problem list. Problem list updated.  Objective:   Vitals:   11/29/16 0946  BP: 121/78  Pulse: (!) 106  Weight: 241 lb (109.3 kg)    Fetal Status: Fetal Heart Rate (bpm): 141 Fundal Height: 24 cm Movement: Present     General:  Alert, oriented and cooperative. Patient is in no acute distress.  Skin: Skin is warm and dry. No rash noted.   Cardiovascular: Normal heart rate noted  Respiratory: Normal respiratory effort, no problems with respiration noted  Abdomen: Soft, gravid, appropriate for gestational age. Pain/Pressure: Absent     Pelvic:  Cervical exam deferred        Extremities: Normal range of motion.  Edema: None  Mental Status: Normal mood and affect. Normal behavior. Normal judgment and thought content.   Urinalysis:    neg (for occasional urinary frequency)  Assessment and Plan:  Pregnancy: [id number] at [redacted]w[redacted]d  1. Encounter for supervision of normal pregnancy, antepartum, unspecified gravidity 2hr GTT nv  2. AMA (advanced maternal age) multigravida 64+, second trimester  3. Abnormal maternal  serum screening test (elevated inhibin) Serial u/s already scheduled for next week  4. Obesity in pregnancy  5. Obesity, Class II, BMI 35-39.9  6. Antepartum multigravida of advanced maternal age  36. History of eclampsia Confirms baby ASA  Preterm labor symptoms and general obstetric precautions including but not limited to vaginal bleeding, contractions, leaking of fluid and fetal movement were reviewed in detail with the patient. Please refer to After Visit Summary for other counseling recommendations.  Return in about 3 weeks (around 12/20/2016) for rob and 2h GTT.   Aletha Halim, MD

## 2016-12-06 ENCOUNTER — Ambulatory Visit (HOSPITAL_COMMUNITY)
Admission: RE | Admit: 2016-12-06 | Discharge: 2016-12-06 | Disposition: A | Discharge status: Home / Self Care | Payer: Medicaid Other | Source: Ambulatory Visit | Attending: Family Medicine | Admitting: Family Medicine

## 2016-12-06 ENCOUNTER — Encounter (HOSPITAL_COMMUNITY): Payer: Self-pay

## 2016-12-06 DIAGNOSIS — O09522 Supervision of elderly multigravida, second trimester: Secondary | ICD-10-CM | POA: Diagnosis present

## 2016-12-06 DIAGNOSIS — Z3A25 25 weeks gestation of pregnancy: Secondary | ICD-10-CM | POA: Insufficient documentation

## 2016-12-06 DIAGNOSIS — Z363 Encounter for antenatal screening for malformations: Secondary | ICD-10-CM | POA: Insufficient documentation

## 2016-12-06 DIAGNOSIS — O99212 Obesity complicating pregnancy, second trimester: Secondary | ICD-10-CM | POA: Insufficient documentation

## 2016-12-06 DIAGNOSIS — O288 Other abnormal findings on antenatal screening of mother: Secondary | ICD-10-CM | POA: Insufficient documentation

## 2016-12-06 DIAGNOSIS — O09892 Supervision of other high risk pregnancies, second trimester: Secondary | ICD-10-CM | POA: Diagnosis not present

## 2016-12-06 DIAGNOSIS — O09899 Supervision of other high risk pregnancies, unspecified trimester: Secondary | ICD-10-CM

## 2016-12-21 ENCOUNTER — Ambulatory Visit (INDEPENDENT_AMBULATORY_CARE_PROVIDER_SITE_OTHER): Payer: Medicaid Other | Admitting: Obstetrics & Gynecology

## 2016-12-21 ENCOUNTER — Encounter: Payer: Self-pay | Admitting: Radiology

## 2016-12-21 VITALS — BP 114/75 | HR 98 | Wt 240.0 lb

## 2016-12-21 DIAGNOSIS — Z8759 Personal history of other complications of pregnancy, childbirth and the puerperium: Secondary | ICD-10-CM

## 2016-12-21 DIAGNOSIS — O09529 Supervision of elderly multigravida, unspecified trimester: Secondary | ICD-10-CM

## 2016-12-21 DIAGNOSIS — Z3490 Encounter for supervision of normal pregnancy, unspecified, unspecified trimester: Secondary | ICD-10-CM

## 2016-12-21 DIAGNOSIS — O99210 Obesity complicating pregnancy, unspecified trimester: Secondary | ICD-10-CM

## 2016-12-21 DIAGNOSIS — Z23 Encounter for immunization: Secondary | ICD-10-CM

## 2016-12-21 DIAGNOSIS — O288 Other abnormal findings on antenatal screening of mother: Secondary | ICD-10-CM

## 2016-12-21 DIAGNOSIS — O09899 Supervision of other high risk pregnancies, unspecified trimester: Secondary | ICD-10-CM

## 2016-12-21 NOTE — Progress Notes (Signed)
   PRENATAL VISIT NOTE  Subjective:  Beth Park is a 36 y.o. [phone] at [redacted]w[redacted]d being seen today for ongoing prenatal care.  She is currently monitored for the following issues for this high-risk pregnancy and has Encounter for supervision of normal pregnancy, unspecified, unspecified trimester; History of eclampsia; Antepartum multigravida of advanced maternal age; Obesity, Class II, BMI 35-39.9; Obesity in pregnancy; and High risk pregnancy with high inhibin on her problem list.  Patient reports no complaints.  Contractions: Not present. Vag. Bleeding: None.  Movement: Present. Denies leaking of fluid.   The following portions of the patient's history were reviewed and updated as appropriate: allergies, current medications, past family history, past medical history, past social history, past surgical history and problem list. Problem list updated.  Objective:   Vitals:   12/21/16 0936  BP: 114/75  Pulse: 98  Weight: 240 lb (108.9 kg)    Fetal Status:     Movement: Present     General:  Alert, oriented and cooperative. Patient is in no acute distress.  Skin: Skin is warm and dry. No rash noted.   Cardiovascular: Normal heart rate noted  Respiratory: Normal respiratory effort, no problems with respiration noted  Abdomen: Soft, gravid, appropriate for gestational age.  Pain/Pressure: Absent     Pelvic: Cervical exam deferred        Extremities: Normal range of motion.  Edema: None  Mental Status:  Normal mood and affect. Normal behavior. Normal judgment and thought content.   Assessment and Plan:  Pregnancy: [id number] at [redacted]w[redacted]d  1. Encounter for supervision of normal pregnancy, antepartum, unspecified gravidity  - CBC - HIV antibody (with reflex) - RPR - Glucose Tolerance, 2 Hours w/1 Hour - Tdap vaccine greater than or equal to 7yo IM (FOB declines TDAP)  2. Needs flu shot  - Flu Vaccine QUAD 36+ mos IM  3. Obesity in pregnancy   4. History of eclampsia   5. High  risk pregnancy with high inhibin   6. Antepartum multigravida of advanced maternal age  Preterm labor symptoms and general obstetric precautions including but not limited to vaginal bleeding, contractions, leaking of fluid and fetal movement were reviewed in detail with the patient. Please refer to After Visit Summary for other counseling recommendations.  Return in about 3 weeks (around 01/11/2017).   Emily Filbert, MD

## 2016-12-22 DIAGNOSIS — Z23 Encounter for immunization: Secondary | ICD-10-CM | POA: Diagnosis not present

## 2016-12-23 LAB — GLUCOSE TOLERANCE, 2 HOURS W/ 1HR
GLUCOSE, 2 HOUR: 127 mg/dL (ref 65–152)
Glucose, 1 hour: 170 mg/dL (ref 65–179)
Glucose, Fasting: 91 mg/dL (ref 65–91)

## 2016-12-26 LAB — CBC
HEMATOCRIT: 34.5 % (ref 34.0–46.6)
HEMOGLOBIN: 11.6 g/dL (ref 11.1–15.9)
MCH: 28.8 pg (ref 26.6–33.0)
MCHC: 33.6 g/dL (ref 31.5–35.7)
MCV: 86 fL (ref 79–97)
Platelets: 232 10*3/uL (ref 150–379)
RBC: 4.03 x10E6/uL (ref 3.77–5.28)
RDW: 13.3 % (ref 12.3–15.4)
WBC: 11.4 10*3/uL — ABNORMAL HIGH (ref 3.4–10.8)

## 2016-12-26 LAB — RPR: RPR Ser Ql: NONREACTIVE

## 2016-12-26 LAB — HIV ANTIBODY (ROUTINE TESTING W REFLEX): HIV Screen 4th Generation wRfx: NONREACTIVE

## 2017-01-11 ENCOUNTER — Encounter: Payer: Self-pay | Admitting: Radiology

## 2017-01-11 ENCOUNTER — Encounter: Payer: Self-pay | Admitting: *Deleted

## 2017-01-11 ENCOUNTER — Ambulatory Visit (INDEPENDENT_AMBULATORY_CARE_PROVIDER_SITE_OTHER): Payer: Medicaid Other | Admitting: Family Medicine

## 2017-01-11 VITALS — BP 122/79 | HR 93 | Wt 247.0 lb

## 2017-01-11 DIAGNOSIS — Z8759 Personal history of other complications of pregnancy, childbirth and the puerperium: Secondary | ICD-10-CM

## 2017-01-11 DIAGNOSIS — O09529 Supervision of elderly multigravida, unspecified trimester: Secondary | ICD-10-CM

## 2017-01-11 DIAGNOSIS — O09899 Supervision of other high risk pregnancies, unspecified trimester: Secondary | ICD-10-CM

## 2017-01-11 DIAGNOSIS — Z3490 Encounter for supervision of normal pregnancy, unspecified, unspecified trimester: Secondary | ICD-10-CM

## 2017-01-11 DIAGNOSIS — O99213 Obesity complicating pregnancy, third trimester: Secondary | ICD-10-CM

## 2017-01-11 DIAGNOSIS — O09893 Supervision of other high risk pregnancies, third trimester: Secondary | ICD-10-CM

## 2017-01-11 DIAGNOSIS — O09523 Supervision of elderly multigravida, third trimester: Secondary | ICD-10-CM

## 2017-01-11 DIAGNOSIS — O99330 Smoking (tobacco) complicating pregnancy, unspecified trimester: Secondary | ICD-10-CM | POA: Insufficient documentation

## 2017-01-11 DIAGNOSIS — O99210 Obesity complicating pregnancy, unspecified trimester: Secondary | ICD-10-CM

## 2017-01-11 DIAGNOSIS — O288 Other abnormal findings on antenatal screening of mother: Secondary | ICD-10-CM

## 2017-01-11 DIAGNOSIS — O99333 Smoking (tobacco) complicating pregnancy, third trimester: Secondary | ICD-10-CM

## 2017-01-11 NOTE — Patient Instructions (Signed)
Breastfeeding Deciding to breastfeed is one of the best choices you can make for you and your baby. A change in hormones during pregnancy causes your breast tissue to grow and increases the number and size of your milk ducts. These hormones also allow proteins, sugars, and fats from your blood supply to make breast milk in your milk-producing glands. Hormones prevent breast milk from being released before your baby is born as well as prompt milk flow after birth. Once breastfeeding has begun, thoughts of your baby, as well as his or her sucking or crying, can stimulate the release of milk from your milk-producing glands. Benefits of breastfeeding For Your Baby  Your first milk (colostrum) helps your baby's digestive system function better.  There are antibodies in your milk that help your baby fight off infections.  Your baby has a lower incidence of asthma, allergies, and sudden infant death syndrome.  The nutrients in breast milk are better for your baby than infant formulas and are designed uniquely for your baby's needs.  Breast milk improves your baby's brain development.  Your baby is less likely to develop other conditions, such as childhood obesity, asthma, or type 2 diabetes mellitus.  For You  Breastfeeding helps to create a very special bond between you and your baby.  Breastfeeding is convenient. Breast milk is always available at the correct temperature and costs nothing.  Breastfeeding helps to burn calories and helps you lose the weight gained during pregnancy.  Breastfeeding makes your uterus contract to its prepregnancy size faster and slows bleeding (lochia) after you give birth.  Breastfeeding helps to lower your risk of developing type 2 diabetes mellitus, osteoporosis, and breast or ovarian cancer later in life.  Signs that your baby is hungry Early Signs of Hunger  Increased alertness or activity.  Stretching.  Movement of the head from side to  side.  Movement of the head and opening of the mouth when the corner of the mouth or cheek is stroked (rooting).  Increased sucking sounds, smacking lips, cooing, sighing, or squeaking.  Hand-to-mouth movements.  Increased sucking of fingers or hands.  Late Signs of Hunger  Fussing.  Intermittent crying.  Extreme Signs of Hunger Signs of extreme hunger will require calming and consoling before your baby will be able to breastfeed successfully. Do not wait for the following signs of extreme hunger to occur before you initiate breastfeeding:  Restlessness.  A loud, strong cry.  Screaming.  Breastfeeding basics Breastfeeding Initiation  Find a comfortable place to sit or lie down, with your neck and back well supported.  Place a pillow or rolled up blanket under your baby to bring him or her to the level of your breast (if you are seated). Nursing pillows are specially designed to help support your arms and your baby while you breastfeed.  Make sure that your baby's abdomen is facing your abdomen.  Gently massage your breast. With your fingertips, massage from your chest wall toward your nipple in a circular motion. This encourages milk flow. You may need to continue this action during the feeding if your milk flows slowly.  Support your breast with 4 fingers underneath and your thumb above your nipple. Make sure your fingers are well away from your nipple and your baby's mouth.  Stroke your baby's lips gently with your finger or nipple.  When your baby's mouth is open wide enough, quickly bring your baby to your breast, placing your entire nipple and as much of the colored area   around your nipple (areola) as possible into your baby's mouth. ? More areola should be visible above your baby's upper lip than below the lower lip. ? Your baby's tongue should be between his or her lower gum and your breast.  Ensure that your baby's mouth is correctly positioned around your nipple  (latched). Your baby's lips should create a seal on your breast and be turned out (everted).  It is common for your baby to suck about 2-3 minutes in order to start the flow of breast milk.  Latching Teaching your baby how to latch on to your breast properly is very important. An improper latch can cause nipple pain and decreased milk supply for you and poor weight gain in your baby. Also, if your baby is not latched onto your nipple properly, he or she may swallow some air during feeding. This can make your baby fussy. Burping your baby when you switch breasts during the feeding can help to get rid of the air. However, teaching your baby to latch on properly is still the best way to prevent fussiness from swallowing air while breastfeeding. Signs that your baby has successfully latched on to your nipple:  Silent tugging or silent sucking, without causing you pain.  Swallowing heard between every 3-4 sucks.  Muscle movement above and in front of his or her ears while sucking.  Signs that your baby has not successfully latched on to nipple:  Sucking sounds or smacking sounds from your baby while breastfeeding.  Nipple pain.  If you think your baby has not latched on correctly, slip your finger into the corner of your baby's mouth to break the suction and place it between your baby's gums. Attempt breastfeeding initiation again. Signs of Successful Breastfeeding Signs from your baby:  A gradual decrease in the number of sucks or complete cessation of sucking.  Falling asleep.  Relaxation of his or her body.  Retention of a small amount of milk in his or her mouth.  Letting go of your breast by himself or herself.  Signs from you:  Breasts that have increased in firmness, weight, and size 1-3 hours after feeding.  Breasts that are softer immediately after breastfeeding.  Increased milk volume, as well as a change in milk consistency and color by the fifth day of  breastfeeding.  Nipples that are not sore, cracked, or bleeding.  Signs That Your Baby is Getting Enough Milk  Wetting at least 1-2 diapers during the first 24 hours after birth.  Wetting at least 5-6 diapers every 24 hours for the first week after birth. The urine should be clear or pale yellow by 5 days after birth.  Wetting 6-8 diapers every 24 hours as your baby continues to grow and develop.  At least 3 stools in a 24-hour period by age 5 days. The stool should be soft and yellow.  At least 3 stools in a 24-hour period by age 7 days. The stool should be seedy and yellow.  No loss of weight greater than 10% of birth weight during the first 3 days of age.  Average weight gain of 4-7 ounces (113-198 g) per week after age 4 days.  Consistent daily weight gain by age 5 days, without weight loss after the age of 2 weeks.  After a feeding, your baby may spit up a small amount. This is common. Breastfeeding frequency and duration Frequent feeding will help you make more milk and can prevent sore nipples and breast engorgement. Breastfeed when   you feel the need to reduce the fullness of your breasts or when your baby shows signs of hunger. This is called "breastfeeding on demand." Avoid introducing a pacifier to your baby while you are working to establish breastfeeding (the first 4-6 weeks after your baby is born). After this time you may choose to use a pacifier. Research has shown that pacifier use during the first year of a baby's life decreases the risk of sudden infant death syndrome (SIDS). Allow your baby to feed on each breast as long as he or she wants. Breastfeed until your baby is finished feeding. When your baby unlatches or falls asleep while feeding from the first breast, offer the second breast. Because newborns are often sleepy in the first few weeks of life, you may need to awaken your baby to get him or her to feed. Breastfeeding times will vary from baby to baby. However,  the following rules can serve as a guide to help you ensure that your baby is properly fed:  Newborns (babies 4 weeks of age or younger) may breastfeed every 1-3 hours.  Newborns should not go longer than 3 hours during the day or 5 hours during the night without breastfeeding.  You should breastfeed your baby a minimum of 8 times in a 24-hour period until you begin to introduce solid foods to your baby at around 6 months of age.  Breast milk pumping Pumping and storing breast milk allows you to ensure that your baby is exclusively fed your breast milk, even at times when you are unable to breastfeed. This is especially important if you are going back to work while you are still breastfeeding or when you are not able to be present during feedings. Your lactation consultant can give you guidelines on how long it is safe to store breast milk. A breast pump is a machine that allows you to pump milk from your breast into a sterile bottle. The pumped breast milk can then be stored in a refrigerator or freezer. Some breast pumps are operated by hand, while others use electricity. Ask your lactation consultant which type will work best for you. Breast pumps can be purchased, but some hospitals and breastfeeding support groups lease breast pumps on a monthly basis. A lactation consultant can teach you how to hand express breast milk, if you prefer not to use a pump. Caring for your breasts while you breastfeed Nipples can become dry, cracked, and sore while breastfeeding. The following recommendations can help keep your breasts moisturized and healthy:  Avoid using soap on your nipples.  Wear a supportive bra. Although not required, special nursing bras and tank tops are designed to allow access to your breasts for breastfeeding without taking off your entire bra or top. Avoid wearing underwire-style bras or extremely tight bras.  Air dry your nipples for 3-4minutes after each feeding.  Use only cotton  bra pads to absorb leaked breast milk. Leaking of breast milk between feedings is normal.  Use lanolin on your nipples after breastfeeding. Lanolin helps to maintain your skin's normal moisture barrier. If you use pure lanolin, you do not need to wash it off before feeding your baby again. Pure lanolin is not toxic to your baby. You may also hand express a few drops of breast milk and gently massage that milk into your nipples and allow the milk to air dry.  In the first few weeks after giving birth, some women experience extremely full breasts (engorgement). Engorgement can make your   breasts feel heavy, warm, and tender to the touch. Engorgement peaks within 3-5 days after you give birth. The following recommendations can help ease engorgement:  Completely empty your breasts while breastfeeding or pumping. You may want to start by applying warm, moist heat (in the shower or with warm water-soaked hand towels) just before feeding or pumping. This increases circulation and helps the milk flow. If your baby does not completely empty your breasts while breastfeeding, pump any extra milk after he or she is finished.  Wear a snug bra (nursing or regular) or tank top for 1-2 days to signal your body to slightly decrease milk production.  Apply ice packs to your breasts, unless this is too uncomfortable for you.  Make sure that your baby is latched on and positioned properly while breastfeeding.  If engorgement persists after 48 hours of following these recommendations, contact your health care provider or a lactation consultant. Overall health care recommendations while breastfeeding  Eat healthy foods. Alternate between meals and snacks, eating 3 of each per day. Because what you eat affects your breast milk, some of the foods may make your baby more irritable than usual. Avoid eating these foods if you are sure that they are negatively affecting your baby.  Drink milk, fruit juice, and water to  satisfy your thirst (about 10 glasses a day).  Rest often, relax, and continue to take your prenatal vitamins to prevent fatigue, stress, and anemia.  Continue breast self-awareness checks.  Avoid chewing and smoking tobacco. Chemicals from cigarettes that pass into breast milk and exposure to secondhand smoke may harm your baby.  Avoid alcohol and drug use, including marijuana. Some medicines that may be harmful to your baby can pass through breast milk. It is important to ask your health care provider before taking any medicine, including all over-the-counter and prescription medicine as well as vitamin and herbal supplements. It is possible to become pregnant while breastfeeding. If birth control is desired, ask your health care provider about options that will be safe for your baby. Contact a health care provider if:  You feel like you want to stop breastfeeding or have become frustrated with breastfeeding.  You have painful breasts or nipples.  Your nipples are cracked or bleeding.  Your breasts are red, tender, or warm.  You have a swollen area on either breast.  You have a fever or chills.  You have nausea or vomiting.  You have drainage other than breast milk from your nipples.  Your breasts do not become full before feedings by the fifth day after you give birth.  You feel sad and depressed.  Your baby is too sleepy to eat well.  Your baby is having trouble sleeping.  Your baby is wetting less than 3 diapers in a 24-hour period.  Your baby has less than 3 stools in a 24-hour period.  Your baby's skin or the white part of his or her eyes becomes yellow.  Your baby is not gaining weight by 5 days of age. Get help right away if:  Your baby is overly tired (lethargic) and does not want to wake up and feed.  Your baby develops an unexplained fever. This information is not intended to replace advice given to you by your health care provider. Make sure you discuss  any questions you have with your health care provider. Document Released: 03/14/2005 Document Revised: 08/26/2015 Document Reviewed: 09/05/2012 Elsevier Interactive Patient Education  2017 [employer]  

## 2017-01-11 NOTE — Progress Notes (Signed)
   PRENATAL VISIT NOTE  Subjective:  Beth Park is a 36 y.o. [phone] at [redacted]w[redacted]d being seen today for ongoing prenatal care.  She is currently monitored for the following issues for this high-risk pregnancy and has Encounter for supervision of normal pregnancy, unspecified, unspecified trimester; History of eclampsia; Antepartum multigravida of advanced maternal age; Obesity, Class II, BMI 35-39.9; Obesity in pregnancy; High risk pregnancy with high inhibin; and Tobacco use affecting pregnancy, antepartum on her problem list.  Patient reports no complaints.  Contractions: Not present. Vag. Bleeding: None.  Movement: Present. Denies leaking of fluid.   The following portions of the patient's history were reviewed and updated as appropriate: allergies, current medications, past family history, past medical history, past social history, past surgical history and problem list. Problem list updated.  Objective:   Vitals:   01/11/17 1120  BP: 122/79  Pulse: 93  Weight: 247 lb (112 kg)    Fetal Status: Fetal Heart Rate (bpm): 140 Fundal Height: 30 cm Movement: Present     General:  Alert, oriented and cooperative. Patient is in no acute distress.  Skin: Skin is warm and dry. No rash noted.   Cardiovascular: Normal heart rate noted  Respiratory: Normal respiratory effort, no problems with respiration noted  Abdomen: Soft, gravid, appropriate for gestational age.  Pain/Pressure: Present     Pelvic: Cervical exam deferred        Extremities: Normal range of motion.  Edema: None  Mental Status:  Normal mood and affect. Normal behavior. Normal judgment and thought content.   Assessment and Plan:  Pregnancy: [id number] at [redacted]w[redacted]d  1. Encounter for supervision of normal pregnancy, antepartum, unspecified gravidity - Signed BTL papers today - UTD   2. History of eclampsia Taking ASA  3. Antepartum multigravida of advanced maternal age Quad wnl, Anatomy wnl  4. Obesity in pregnancy Weight  gain: 27 lb (12.2 kg) which is above expected  5. High risk pregnancy with high inhibin Monitor for PREX  Preterm labor symptoms and general obstetric precautions including but not limited to vaginal bleeding, contractions, leaking of fluid and fetal movement were reviewed in detail with the patient. Please refer to After Visit Summary for other counseling recommendations.  Return in about 2 weeks (around 01/25/2017) for Routine prenatal care.   Caren Macadam, MD

## 2017-01-25 ENCOUNTER — Ambulatory Visit (INDEPENDENT_AMBULATORY_CARE_PROVIDER_SITE_OTHER): Payer: Medicaid Other | Admitting: Family Medicine

## 2017-01-25 ENCOUNTER — Encounter: Payer: Self-pay | Admitting: *Deleted

## 2017-01-25 VITALS — BP 120/75 | HR 101 | Wt 247.0 lb

## 2017-01-25 DIAGNOSIS — Z3480 Encounter for supervision of other normal pregnancy, unspecified trimester: Secondary | ICD-10-CM

## 2017-01-25 DIAGNOSIS — O09893 Supervision of other high risk pregnancies, third trimester: Secondary | ICD-10-CM

## 2017-01-25 DIAGNOSIS — Z8759 Personal history of other complications of pregnancy, childbirth and the puerperium: Secondary | ICD-10-CM

## 2017-01-25 DIAGNOSIS — O09899 Supervision of other high risk pregnancies, unspecified trimester: Secondary | ICD-10-CM

## 2017-01-25 DIAGNOSIS — O99333 Smoking (tobacco) complicating pregnancy, third trimester: Secondary | ICD-10-CM

## 2017-01-25 DIAGNOSIS — O09523 Supervision of elderly multigravida, third trimester: Secondary | ICD-10-CM | POA: Diagnosis not present

## 2017-01-25 DIAGNOSIS — O288 Other abnormal findings on antenatal screening of mother: Secondary | ICD-10-CM

## 2017-01-25 DIAGNOSIS — O09529 Supervision of elderly multigravida, unspecified trimester: Secondary | ICD-10-CM

## 2017-01-25 DIAGNOSIS — O99330 Smoking (tobacco) complicating pregnancy, unspecified trimester: Secondary | ICD-10-CM

## 2017-01-25 DIAGNOSIS — O99210 Obesity complicating pregnancy, unspecified trimester: Secondary | ICD-10-CM

## 2017-01-25 DIAGNOSIS — O99213 Obesity complicating pregnancy, third trimester: Secondary | ICD-10-CM

## 2017-01-25 MED ORDER — PREPLUS 27-1 MG PO TABS: 1.0000 | ORAL_TABLET | Freq: Every day | ORAL | 13 refills | Status: DC

## 2017-01-25 NOTE — Progress Notes (Signed)
   PRENATAL VISIT NOTE  Subjective:  Beth Park is a 36 y.o. [phone] at [redacted]w[redacted]d being seen today for ongoing prenatal care.  She is currently monitored for the following issues for this high-risk pregnancy and has Encounter for supervision of normal pregnancy, unspecified, unspecified trimester; History of eclampsia; Antepartum multigravida of advanced maternal age; Obesity, Class II, BMI 35-39.9; Obesity in pregnancy; High risk pregnancy with high inhibin; and Tobacco use affecting pregnancy, antepartum on their problem list.  Patient reports no complaints.  Contractions: Not present. Vag. Bleeding: None.  Movement: Present. Denies leaking of fluid.   The following portions of the patient's history were reviewed and updated as appropriate: allergies, current medications, past family history, past medical history, past social history, past surgical history and problem list. Problem list updated.  Objective:   Vitals:   01/25/17 1654  BP: 120/75  Pulse: (!) 101  Weight: 247 lb (112 kg)    Fetal Status: Fetal Heart Rate (bpm): 140 Fundal Height: 32 cm Movement: Present     General:  Alert, oriented and cooperative. Patient is in no acute distress.  Skin: Skin is warm and dry. No rash noted.   Cardiovascular: Normal heart rate noted  Respiratory: Normal respiratory effort, no problems with respiration noted  Abdomen: Soft, gravid, appropriate for gestational age.  Pain/Pressure: Present     Pelvic: Cervical exam deferred        Extremities: Normal range of motion.  Edema: None  Mental Status:  Normal mood and affect. Normal behavior. Normal judgment and thought content.   Assessment and Plan:  Pregnancy: [id number] at [redacted]w[redacted]d  1. Antepartum multigravida of advanced maternal age  74. History of eclampsia Taking ASA  3. Obesity in pregnancy 27 lb (12.2 kg) - greater than goal  4. High risk pregnancy with high inhibin  5. Tobacco use affecting pregnancy, antepartum Reports  smoking-7-8 cigs per day.  Baby is a motivator to reduce amount, desires to quit before baby comes Does not work at work  Goal: take 3 cig to work and only smoke those. Will check in with patient at next visit.  Spent 3-10 minutes discussing tobacco use and reduction/cessation.   6. Supervision of other normal pregnancy, antepartum UTD   Preterm labor symptoms and general obstetric precautions including but not limited to vaginal bleeding, contractions, leaking of fluid and fetal movement were reviewed in detail with the patient. Please refer to After Visit Summary for other counseling recommendations.  Return in about 2 weeks (around 02/08/2017) for Routine prenatal care.   Caren Macadam, MD

## 2017-02-09 ENCOUNTER — Encounter: Payer: Self-pay | Admitting: Family Medicine

## 2017-02-09 ENCOUNTER — Encounter: Payer: Self-pay | Admitting: Radiology

## 2017-02-09 ENCOUNTER — Ambulatory Visit (INDEPENDENT_AMBULATORY_CARE_PROVIDER_SITE_OTHER): Payer: Medicaid Other | Admitting: Family Medicine

## 2017-02-09 VITALS — BP 115/76 | HR 105 | Wt 246.0 lb

## 2017-02-09 DIAGNOSIS — Z3483 Encounter for supervision of other normal pregnancy, third trimester: Secondary | ICD-10-CM

## 2017-02-09 DIAGNOSIS — Z3480 Encounter for supervision of other normal pregnancy, unspecified trimester: Secondary | ICD-10-CM

## 2017-02-09 NOTE — Patient Instructions (Signed)
Breastfeeding Deciding to breastfeed is one of the best choices you can make for you and your baby. A change in hormones during pregnancy causes your breast tissue to grow and increases the number and size of your milk ducts. These hormones also allow proteins, sugars, and fats from your blood supply to make breast milk in your milk-producing glands. Hormones prevent breast milk from being released before your baby is born as well as prompt milk flow after birth. Once breastfeeding has begun, thoughts of your baby, as well as his or her sucking or crying, can stimulate the release of milk from your milk-producing glands. Benefits of breastfeeding For Your Baby  Your first milk (colostrum) helps your baby's digestive system function better.  There are antibodies in your milk that help your baby fight off infections.  Your baby has a lower incidence of asthma, allergies, and sudden infant death syndrome.  The nutrients in breast milk are better for your baby than infant formulas and are designed uniquely for your baby's needs.  Breast milk improves your baby's brain development.  Your baby is less likely to develop other conditions, such as childhood obesity, asthma, or type 2 diabetes mellitus.  For You  Breastfeeding helps to create a very special bond between you and your baby.  Breastfeeding is convenient. Breast milk is always available at the correct temperature and costs nothing.  Breastfeeding helps to burn calories and helps you lose the weight gained during pregnancy.  Breastfeeding makes your uterus contract to its prepregnancy size faster and slows bleeding (lochia) after you give birth.  Breastfeeding helps to lower your risk of developing type 2 diabetes mellitus, osteoporosis, and breast or ovarian cancer later in life.  Signs that your baby is hungry Early Signs of Hunger  Increased alertness or activity.  Stretching.  Movement of the head from side to  side.  Movement of the head and opening of the mouth when the corner of the mouth or cheek is stroked (rooting).  Increased sucking sounds, smacking lips, cooing, sighing, or squeaking.  Hand-to-mouth movements.  Increased sucking of fingers or hands.  Late Signs of Hunger  Fussing.  Intermittent crying.  Extreme Signs of Hunger Signs of extreme hunger will require calming and consoling before your baby will be able to breastfeed successfully. Do not wait for the following signs of extreme hunger to occur before you initiate breastfeeding:  Restlessness.  A loud, strong cry.  Screaming.  Breastfeeding basics Breastfeeding Initiation  Find a comfortable place to sit or lie down, with your neck and back well supported.  Place a pillow or rolled up blanket under your baby to bring him or her to the level of your breast (if you are seated). Nursing pillows are specially designed to help support your arms and your baby while you breastfeed.  Make sure that your baby's abdomen is facing your abdomen.  Gently massage your breast. With your fingertips, massage from your chest wall toward your nipple in a circular motion. This encourages milk flow. You may need to continue this action during the feeding if your milk flows slowly.  Support your breast with 4 fingers underneath and your thumb above your nipple. Make sure your fingers are well away from your nipple and your baby's mouth.  Stroke your baby's lips gently with your finger or nipple.  When your baby's mouth is open wide enough, quickly bring your baby to your breast, placing your entire nipple and as much of the colored area   around your nipple (areola) as possible into your baby's mouth. ? More areola should be visible above your baby's upper lip than below the lower lip. ? Your baby's tongue should be between his or her lower gum and your breast.  Ensure that your baby's mouth is correctly positioned around your nipple  (latched). Your baby's lips should create a seal on your breast and be turned out (everted).  It is common for your baby to suck about 2-3 minutes in order to start the flow of breast milk.  Latching Teaching your baby how to latch on to your breast properly is very important. An improper latch can cause nipple pain and decreased milk supply for you and poor weight gain in your baby. Also, if your baby is not latched onto your nipple properly, he or she may swallow some air during feeding. This can make your baby fussy. Burping your baby when you switch breasts during the feeding can help to get rid of the air. However, teaching your baby to latch on properly is still the best way to prevent fussiness from swallowing air while breastfeeding. Signs that your baby has successfully latched on to your nipple:  Silent tugging or silent sucking, without causing you pain.  Swallowing heard between every 3-4 sucks.  Muscle movement above and in front of his or her ears while sucking.  Signs that your baby has not successfully latched on to nipple:  Sucking sounds or smacking sounds from your baby while breastfeeding.  Nipple pain.  If you think your baby has not latched on correctly, slip your finger into the corner of your baby's mouth to break the suction and place it between your baby's gums. Attempt breastfeeding initiation again. Signs of Successful Breastfeeding Signs from your baby:  A gradual decrease in the number of sucks or complete cessation of sucking.  Falling asleep.  Relaxation of his or her body.  Retention of a small amount of milk in his or her mouth.  Letting go of your breast by himself or herself.  Signs from you:  Breasts that have increased in firmness, weight, and size 1-3 hours after feeding.  Breasts that are softer immediately after breastfeeding.  Increased milk volume, as well as a change in milk consistency and color by the fifth day of  breastfeeding.  Nipples that are not sore, cracked, or bleeding.  Signs That Your Baby is Getting Enough Milk  Wetting at least 1-2 diapers during the first 24 hours after birth.  Wetting at least 5-6 diapers every 24 hours for the first week after birth. The urine should be clear or pale yellow by 5 days after birth.  Wetting 6-8 diapers every 24 hours as your baby continues to grow and develop.  At least 3 stools in a 24-hour period by age 5 days. The stool should be soft and yellow.  At least 3 stools in a 24-hour period by age 7 days. The stool should be seedy and yellow.  No loss of weight greater than 10% of birth weight during the first 3 days of age.  Average weight gain of 4-7 ounces (113-198 g) per week after age 4 days.  Consistent daily weight gain by age 5 days, without weight loss after the age of 2 weeks.  After a feeding, your baby may spit up a small amount. This is common. Breastfeeding frequency and duration Frequent feeding will help you make more milk and can prevent sore nipples and breast engorgement. Breastfeed when   you feel the need to reduce the fullness of your breasts or when your baby shows signs of hunger. This is called "breastfeeding on demand." Avoid introducing a pacifier to your baby while you are working to establish breastfeeding (the first 4-6 weeks after your baby is born). After this time you may choose to use a pacifier. Research has shown that pacifier use during the first year of a baby's life decreases the risk of sudden infant death syndrome (SIDS). Allow your baby to feed on each breast as long as he or she wants. Breastfeed until your baby is finished feeding. When your baby unlatches or falls asleep while feeding from the first breast, offer the second breast. Because newborns are often sleepy in the first few weeks of life, you may need to awaken your baby to get him or her to feed. Breastfeeding times will vary from baby to baby. However,  the following rules can serve as a guide to help you ensure that your baby is properly fed:  Newborns (babies 4 weeks of age or younger) may breastfeed every 1-3 hours.  Newborns should not go longer than 3 hours during the day or 5 hours during the night without breastfeeding.  You should breastfeed your baby a minimum of 8 times in a 24-hour period until you begin to introduce solid foods to your baby at around 6 months of age.  Breast milk pumping Pumping and storing breast milk allows you to ensure that your baby is exclusively fed your breast milk, even at times when you are unable to breastfeed. This is especially important if you are going back to work while you are still breastfeeding or when you are not able to be present during feedings. Your lactation consultant can give you guidelines on how long it is safe to store breast milk. A breast pump is a machine that allows you to pump milk from your breast into a sterile bottle. The pumped breast milk can then be stored in a refrigerator or freezer. Some breast pumps are operated by hand, while others use electricity. Ask your lactation consultant which type will work best for you. Breast pumps can be purchased, but some hospitals and breastfeeding support groups lease breast pumps on a monthly basis. A lactation consultant can teach you how to hand express breast milk, if you prefer not to use a pump. Caring for your breasts while you breastfeed Nipples can become dry, cracked, and sore while breastfeeding. The following recommendations can help keep your breasts moisturized and healthy:  Avoid using soap on your nipples.  Wear a supportive bra. Although not required, special nursing bras and tank tops are designed to allow access to your breasts for breastfeeding without taking off your entire bra or top. Avoid wearing underwire-style bras or extremely tight bras.  Air dry your nipples for 3-4minutes after each feeding.  Use only cotton  bra pads to absorb leaked breast milk. Leaking of breast milk between feedings is normal.  Use lanolin on your nipples after breastfeeding. Lanolin helps to maintain your skin's normal moisture barrier. If you use pure lanolin, you do not need to wash it off before feeding your baby again. Pure lanolin is not toxic to your baby. You may also hand express a few drops of breast milk and gently massage that milk into your nipples and allow the milk to air dry.  In the first few weeks after giving birth, some women experience extremely full breasts (engorgement). Engorgement can make your   breasts feel heavy, warm, and tender to the touch. Engorgement peaks within 3-5 days after you give birth. The following recommendations can help ease engorgement:  Completely empty your breasts while breastfeeding or pumping. You may want to start by applying warm, moist heat (in the shower or with warm water-soaked hand towels) just before feeding or pumping. This increases circulation and helps the milk flow. If your baby does not completely empty your breasts while breastfeeding, pump any extra milk after he or she is finished.  Wear a snug bra (nursing or regular) or tank top for 1-2 days to signal your body to slightly decrease milk production.  Apply ice packs to your breasts, unless this is too uncomfortable for you.  Make sure that your baby is latched on and positioned properly while breastfeeding.  If engorgement persists after 48 hours of following these recommendations, contact your health care provider or a lactation consultant. Overall health care recommendations while breastfeeding  Eat healthy foods. Alternate between meals and snacks, eating 3 of each per day. Because what you eat affects your breast milk, some of the foods may make your baby more irritable than usual. Avoid eating these foods if you are sure that they are negatively affecting your baby.  Drink milk, fruit juice, and water to  satisfy your thirst (about 10 glasses a day).  Rest often, relax, and continue to take your prenatal vitamins to prevent fatigue, stress, and anemia.  Continue breast self-awareness checks.  Avoid chewing and smoking tobacco. Chemicals from cigarettes that pass into breast milk and exposure to secondhand smoke may harm your baby.  Avoid alcohol and drug use, including marijuana. Some medicines that may be harmful to your baby can pass through breast milk. It is important to ask your health care provider before taking any medicine, including all over-the-counter and prescription medicine as well as vitamin and herbal supplements. It is possible to become pregnant while breastfeeding. If birth control is desired, ask your health care provider about options that will be safe for your baby. Contact a health care provider if:  You feel like you want to stop breastfeeding or have become frustrated with breastfeeding.  You have painful breasts or nipples.  Your nipples are cracked or bleeding.  Your breasts are red, tender, or warm.  You have a swollen area on either breast.  You have a fever or chills.  You have nausea or vomiting.  You have drainage other than breast milk from your nipples.  Your breasts do not become full before feedings by the fifth day after you give birth.  You feel sad and depressed.  Your baby is too sleepy to eat well.  Your baby is having trouble sleeping.  Your baby is wetting less than 3 diapers in a 24-hour period.  Your baby has less than 3 stools in a 24-hour period.  Your baby's skin or the white part of his or her eyes becomes yellow.  Your baby is not gaining weight by 5 days of age. Get help right away if:  Your baby is overly tired (lethargic) and does not want to wake up and feed.  Your baby develops an unexplained fever. This information is not intended to replace advice given to you by your health care provider. Make sure you discuss  any questions you have with your health care provider. Document Released: 03/14/2005 Document Revised: 08/26/2015 Document Reviewed: 09/05/2012 Elsevier Interactive Patient Education  2017 [employer]  

## 2017-02-09 NOTE — Progress Notes (Signed)
   PRENATAL VISIT NOTE  Subjective:  Beth Park is a 36 y.o. [phone] at [redacted]w[redacted]d being seen today for ongoing prenatal care.  She is currently monitored for the following issues for this high-risk pregnancy and has Encounter for supervision of normal pregnancy, unspecified, unspecified trimester; History of eclampsia; Antepartum multigravida of advanced maternal age; Obesity, Class II, BMI 35-39.9; Obesity in pregnancy; High risk pregnancy with high inhibin; and Tobacco use affecting pregnancy, antepartum on their problem list.  Patient reports no complaints.  Contractions: Not present. Vag. Bleeding: None.  Movement: Present. Denies leaking of fluid.   The following portions of the patient's history were reviewed and updated as appropriate: allergies, current medications, past family history, past medical history, past social history, past surgical history and problem list. Problem list updated.  Objective:   Vitals:   02/09/17 0921  BP: 115/76  Pulse: (!) 105  Weight: 246 lb (111.6 kg)    Fetal Status: Fetal Heart Rate (bpm): 141 Fundal Height: 34 cm Movement: Present  Presentation: Vertex  General:  Alert, oriented and cooperative. Patient is in no acute distress.  Skin: Skin is warm and dry. No rash noted.   Cardiovascular: Normal heart rate noted  Respiratory: Normal respiratory effort, no problems with respiration noted  Abdomen: Soft, gravid, appropriate for gestational age.  Pain/Pressure: Present     Pelvic: Cervical exam deferred        Extremities: Normal range of motion.  Edema: None  Mental Status:  Normal mood and affect. Normal behavior. Normal judgment and thought content.   Assessment and Plan:  Pregnancy: A0T6226 at [redacted]w[redacted]d  1. Supervision of other normal pregnancy, antepartum Continue prenatal care. Cultures next visit.   Preterm labor symptoms and general obstetric precautions including but not limited to vaginal bleeding, contractions, leaking of fluid and  fetal movement were reviewed in detail with the patient. Please refer to After Visit Summary for other counseling recommendations.  Return in 2 weeks (on 02/23/2017).   Donnamae Jude, MD

## 2017-02-23 ENCOUNTER — Other Ambulatory Visit (HOSPITAL_COMMUNITY)
Admission: RE | Admit: 2017-02-23 | Discharge: 2017-02-23 | Disposition: A | Discharge status: Home / Self Care | Payer: Medicaid Other | Source: Ambulatory Visit | Attending: Obstetrics & Gynecology | Admitting: Obstetrics & Gynecology

## 2017-02-23 ENCOUNTER — Ambulatory Visit (INDEPENDENT_AMBULATORY_CARE_PROVIDER_SITE_OTHER): Payer: Medicaid Other | Admitting: Obstetrics & Gynecology

## 2017-02-23 ENCOUNTER — Encounter: Payer: Self-pay | Admitting: Radiology

## 2017-02-23 VITALS — BP 118/80 | HR 103 | Wt 246.8 lb

## 2017-02-23 DIAGNOSIS — O09523 Supervision of elderly multigravida, third trimester: Secondary | ICD-10-CM | POA: Insufficient documentation

## 2017-02-23 DIAGNOSIS — Z3480 Encounter for supervision of other normal pregnancy, unspecified trimester: Secondary | ICD-10-CM

## 2017-02-23 DIAGNOSIS — O99213 Obesity complicating pregnancy, third trimester: Secondary | ICD-10-CM

## 2017-02-23 DIAGNOSIS — E669 Obesity, unspecified: Secondary | ICD-10-CM

## 2017-02-23 DIAGNOSIS — O99210 Obesity complicating pregnancy, unspecified trimester: Secondary | ICD-10-CM

## 2017-02-23 DIAGNOSIS — Z3A36 36 weeks gestation of pregnancy: Secondary | ICD-10-CM | POA: Insufficient documentation

## 2017-02-23 DIAGNOSIS — Z3483 Encounter for supervision of other normal pregnancy, third trimester: Secondary | ICD-10-CM

## 2017-02-23 DIAGNOSIS — Z8759 Personal history of other complications of pregnancy, childbirth and the puerperium: Secondary | ICD-10-CM

## 2017-02-23 DIAGNOSIS — Z6838 Body mass index (BMI) 38.0-38.9, adult: Secondary | ICD-10-CM | POA: Insufficient documentation

## 2017-02-23 NOTE — Progress Notes (Signed)
   PRENATAL VISIT NOTE  Subjective:  Beth Park is a 36 y.o. [phone] at [redacted]w[redacted]d being seen today for ongoing prenatal care.  She is currently monitored for the following issues for this low-risk pregnancy and has Encounter for supervision of normal pregnancy, unspecified, unspecified trimester; History of eclampsia; Antepartum multigravida of advanced maternal age; Obesity, Class II, BMI 35-39.9; Obesity in pregnancy; High risk pregnancy with high inhibin; and Tobacco use affecting pregnancy, antepartum on their problem list.  Patient reports no complaints.  Contractions: Not present.  .  Movement: Present. Denies leaking of fluid.   The following portions of the patient's history were reviewed and updated as appropriate: allergies, current medications, past family history, past medical history, past social history, past surgical history and problem list. Problem list updated.  Objective:   Vitals:   02/23/17 0950  BP: 118/80  Pulse: (!) 103  Weight: 246 lb 12.8 oz (111.9 kg)    Fetal Status: Fetal Heart Rate (bpm): 138   Movement: Present     General:  Alert, oriented and cooperative. Patient is in no acute distress.  Skin: Skin is warm and dry. No rash noted.   Cardiovascular: Normal heart rate noted  Respiratory: Normal respiratory effort, no problems with respiration noted  Abdomen: Soft, gravid, appropriate for gestational age.  Pain/Pressure: Present     Pelvic: Cervical exam performed        Extremities: Normal range of motion.  Edema: None  Mental Status:  Normal mood and affect. Normal behavior. Normal judgment and thought content.   Assessment and Plan:  Pregnancy: [id number] at [redacted]w[redacted]d  1. History of eclampsia   2. Obesity in pregnancy    - Strep Gp B NAA - GC/Chlamydia probe amp ([place])not at [facility]  Preterm labor symptoms and general obstetric precautions including but not limited to vaginal bleeding, contractions, leaking of fluid and fetal movement were  reviewed in detail with the patient. Please refer to After Visit Summary for other counseling recommendations.  No Follow-up on file.   Emily Filbert, MD

## 2017-02-24 LAB — GC/CHLAMYDIA PROBE AMP (~~LOC~~) NOT AT ARMC
Chlamydia: NEGATIVE
Neisseria Gonorrhea: NEGATIVE

## 2017-02-25 LAB — STREP GP B NAA: STREP GROUP B AG: NEGATIVE

## 2017-03-02 ENCOUNTER — Encounter: Payer: Medicaid Other | Admitting: Obstetrics & Gynecology

## 2017-03-02 ENCOUNTER — Telehealth: Payer: Self-pay | Admitting: Radiology

## 2017-03-02 NOTE — Telephone Encounter (Signed)
Patient called cancelling her 9:00 (Beth Park) with Dr Hulan Fray, Stating that she doesn't have a ride. I tried to reschedule the appointment for the following Monday with Dr Ilda Basset 03/06/17. All other days are full, she states "not to worry about it, that she will call back to schedule an appointment"

## 2017-03-09 ENCOUNTER — Ambulatory Visit (INDEPENDENT_AMBULATORY_CARE_PROVIDER_SITE_OTHER): Payer: Medicaid Other | Admitting: Obstetrics and Gynecology

## 2017-03-09 VITALS — BP 116/80 | HR 99 | Wt 253.0 lb

## 2017-03-09 DIAGNOSIS — Z3480 Encounter for supervision of other normal pregnancy, unspecified trimester: Secondary | ICD-10-CM

## 2017-03-09 DIAGNOSIS — E669 Obesity, unspecified: Secondary | ICD-10-CM

## 2017-03-09 DIAGNOSIS — O99210 Obesity complicating pregnancy, unspecified trimester: Secondary | ICD-10-CM

## 2017-03-09 DIAGNOSIS — O09899 Supervision of other high risk pregnancies, unspecified trimester: Secondary | ICD-10-CM

## 2017-03-09 DIAGNOSIS — Z8759 Personal history of other complications of pregnancy, childbirth and the puerperium: Secondary | ICD-10-CM

## 2017-03-09 DIAGNOSIS — O09529 Supervision of elderly multigravida, unspecified trimester: Secondary | ICD-10-CM

## 2017-03-09 DIAGNOSIS — O288 Other abnormal findings on antenatal screening of mother: Secondary | ICD-10-CM

## 2017-03-09 NOTE — Progress Notes (Signed)
Prenatal Visit Note Date: 03/09/2017 Clinic: [facility]  Subjective:  Beth Park is a 36 y.o. [phone] at [redacted]w[redacted]d being seen today for ongoing prenatal care.  She is currently monitored for the following issues for this low-risk pregnancy and has Encounter for supervision of normal pregnancy, unspecified, unspecified trimester; History of eclampsia; Antepartum multigravida of advanced maternal age; Obesity, Class II, BMI 35-39.9; Obesity in pregnancy; High risk pregnancy with high inhibin; and Tobacco use affecting pregnancy, antepartum on their problem list.  Patient reports no complaints.   Contractions: Not present. Vag. Bleeding: None.  Movement: Present. Denies leaking of fluid.   The following portions of the patient's history were reviewed and updated as appropriate: allergies, current medications, past family history, past medical history, past social history, past surgical history and problem list. Problem list updated.  Objective:   Vitals:   03/09/17 1027  BP: 116/80  Pulse: 99  Weight: 253 lb (114.8 kg)    Fetal Status: Fetal Heart Rate (bpm): 141 Fundal Height: 38 cm Movement: Present  Presentation: Vertex  General:  Alert, oriented and cooperative. Patient is in no acute distress.  Skin: Skin is warm and dry. No rash noted.   Cardiovascular: Normal heart rate noted  Respiratory: Normal respiratory effort, no problems with respiration noted  Abdomen: Soft, gravid, appropriate for gestational age. Pain/Pressure: Present     Pelvic:  Cervical exam deferred        Extremities: Normal range of motion.  Edema: None  Mental Status: Normal mood and affect. Normal behavior. Normal judgment and thought content.   Urinalysis:      Assessment and Plan:  Pregnancy: D3T7017 at [redacted]w[redacted]d  1. Supervision of other normal pregnancy, antepartum Routine care. BTL (papers UTD)  2. History of eclampsia Continue low doses asa  3. Antepartum multigravida of  advanced maternal age  53. Obesity in pregnancy  5. Obesity, Class II, BMI 35-39.9  6. High risk pregnancy with high inhibin - [place] MFM OB FOLLOW UP; Future  Term labor symptoms and general obstetric precautions including but not limited to vaginal bleeding, contractions, leaking of fluid and fetal movement were reviewed in detail with the patient. Please refer to After Visit Summary for other counseling recommendations.  Return in about 1 week (around 03/16/2017) for rob.   Aletha Halim, MD

## 2017-03-12 ENCOUNTER — Inpatient Hospital Stay (HOSPITAL_COMMUNITY)
Admission: AD | Admit: 2017-03-12 | Discharge: 2017-03-14 | DRG: 798 | Disposition: A | Discharge status: Home / Self Care | Payer: Medicaid Other | Source: Ambulatory Visit | Attending: Obstetrics & Gynecology | Admitting: Obstetrics & Gynecology

## 2017-03-12 ENCOUNTER — Encounter (HOSPITAL_COMMUNITY): Payer: Self-pay

## 2017-03-12 DIAGNOSIS — E669 Obesity, unspecified: Secondary | ICD-10-CM | POA: Diagnosis present

## 2017-03-12 DIAGNOSIS — O99334 Smoking (tobacco) complicating childbirth: Principal | ICD-10-CM | POA: Diagnosis present

## 2017-03-12 DIAGNOSIS — E66812 Obesity, class 2: Secondary | ICD-10-CM | POA: Diagnosis present

## 2017-03-12 DIAGNOSIS — F17210 Nicotine dependence, cigarettes, uncomplicated: Secondary | ICD-10-CM | POA: Diagnosis present

## 2017-03-12 DIAGNOSIS — Z3A38 38 weeks gestation of pregnancy: Secondary | ICD-10-CM

## 2017-03-12 DIAGNOSIS — Z3490 Encounter for supervision of normal pregnancy, unspecified, unspecified trimester: Secondary | ICD-10-CM

## 2017-03-12 DIAGNOSIS — Z302 Encounter for sterilization: Secondary | ICD-10-CM

## 2017-03-12 DIAGNOSIS — Z3009 Encounter for other general counseling and advice on contraception: Secondary | ICD-10-CM

## 2017-03-12 DIAGNOSIS — O99214 Obesity complicating childbirth: Secondary | ICD-10-CM | POA: Diagnosis present

## 2017-03-12 DIAGNOSIS — O99330 Smoking (tobacco) complicating pregnancy, unspecified trimester: Secondary | ICD-10-CM | POA: Diagnosis present

## 2017-03-12 DIAGNOSIS — Z3483 Encounter for supervision of other normal pregnancy, third trimester: Secondary | ICD-10-CM | POA: Diagnosis present

## 2017-03-12 DIAGNOSIS — Z8759 Personal history of other complications of pregnancy, childbirth and the puerperium: Secondary | ICD-10-CM

## 2017-03-12 LAB — TYPE AND SCREEN
ABO/RH(D): O POS
Antibody Screen: NEGATIVE

## 2017-03-12 LAB — CBC
HCT: 48.5 % — ABNORMAL HIGH (ref 36.0–46.0)
Hemoglobin: 16.5 g/dL — ABNORMAL HIGH (ref 12.0–15.0)
MCH: 29.1 pg (ref 26.0–34.0)
MCHC: 34 g/dL (ref 30.0–36.0)
MCV: 85.5 fL (ref 78.0–100.0)
PLATELETS: 155 10*3/uL (ref 150–400)
RBC: 5.67 MIL/uL — ABNORMAL HIGH (ref 3.87–5.11)
RDW: 13.5 % (ref 11.5–15.5)
WBC: 8.5 10*3/uL (ref 4.0–10.5)

## 2017-03-12 MED ORDER — ZOLPIDEM TARTRATE 5 MG PO TABS: 5.0000 mg | ORAL_TABLET | Freq: Every evening | ORAL | Status: DC | PRN

## 2017-03-12 MED ORDER — METHYLERGONOVINE MALEATE 0.2 MG/ML IJ SOLN
0.2000 mg | Freq: Once | INTRAMUSCULAR | Status: AC
  Administered 2017-03-12: 0.2 mg via INTRAMUSCULAR
  Filled 2017-03-12: qty 1

## 2017-03-12 MED ORDER — WITCH HAZEL-GLYCERIN EX PADS: 1.0000 "application " | MEDICATED_PAD | CUTANEOUS | Status: DC | PRN

## 2017-03-12 MED ORDER — DIPHENHYDRAMINE HCL 25 MG PO CAPS: 25.0000 mg | ORAL_CAPSULE | Freq: Four times a day (QID) | ORAL | Status: DC | PRN

## 2017-03-12 MED ORDER — ONDANSETRON HCL 4 MG/2ML IJ SOLN: 4.0000 mg | Freq: Four times a day (QID) | INTRAMUSCULAR | Status: DC | PRN

## 2017-03-12 MED ORDER — SENNOSIDES-DOCUSATE SODIUM 8.6-50 MG PO TABS
2.0000 | ORAL_TABLET | ORAL | Status: DC
Start: 2017-03-13 — End: 2017-03-14
  Administered 2017-03-14: 2 via ORAL
  Filled 2017-03-12 (×2): qty 2

## 2017-03-12 MED ORDER — COCONUT OIL OIL: 1.0000 "application " | TOPICAL_OIL | Status: DC | PRN

## 2017-03-12 MED ORDER — ONDANSETRON HCL 4 MG PO TABS: 4.0000 mg | ORAL_TABLET | ORAL | Status: DC | PRN

## 2017-03-12 MED ORDER — SIMETHICONE 80 MG PO CHEW: 80.0000 mg | CHEWABLE_TABLET | ORAL | Status: DC | PRN

## 2017-03-12 MED ORDER — LACTATED RINGERS IV SOLN: 500.0000 mL | INTRAVENOUS | Status: DC | PRN

## 2017-03-12 MED ORDER — TETANUS-DIPHTH-ACELL PERTUSSIS 5-2.5-18.5 LF-MCG/0.5 IM SUSP: 0.5000 mL | Freq: Once | INTRAMUSCULAR | Status: DC

## 2017-03-12 MED ORDER — OXYCODONE-ACETAMINOPHEN 5-325 MG PO TABS: 1.0000 | ORAL_TABLET | ORAL | Status: DC | PRN

## 2017-03-12 MED ORDER — ONDANSETRON HCL 4 MG/2ML IJ SOLN: 4.0000 mg | INTRAMUSCULAR | Status: DC | PRN

## 2017-03-12 MED ORDER — OXYTOCIN 10 UNIT/ML IJ SOLN
INTRAMUSCULAR | Status: AC
  Filled 2017-03-12: qty 1

## 2017-03-12 MED ORDER — OXYTOCIN BOLUS FROM INFUSION
500.0000 mL | Freq: Once | INTRAVENOUS | Status: AC
  Administered 2017-03-12: 500 mL via INTRAVENOUS

## 2017-03-12 MED ORDER — MEASLES, MUMPS & RUBELLA VAC ~~LOC~~ INJ
0.5000 mL | INJECTION | Freq: Once | SUBCUTANEOUS | Status: DC
  Filled 2017-03-12: qty 0.5

## 2017-03-12 MED ORDER — BENZOCAINE-MENTHOL 20-0.5 % EX AERO
1.0000 "application " | INHALATION_SPRAY | CUTANEOUS | Status: DC | PRN
  Administered 2017-03-12: 1 via TOPICAL
  Filled 2017-03-12: qty 56

## 2017-03-12 MED ORDER — ACETAMINOPHEN 325 MG PO TABS: 650.0000 mg | ORAL_TABLET | ORAL | Status: DC | PRN

## 2017-03-12 MED ORDER — ACETAMINOPHEN 325 MG PO TABS
650.0000 mg | ORAL_TABLET | ORAL | Status: DC | PRN
  Administered 2017-03-12 – 2017-03-13 (×2): 650 mg via ORAL
  Filled 2017-03-12 (×2): qty 2

## 2017-03-12 MED ORDER — IBUPROFEN 600 MG PO TABS
600.0000 mg | ORAL_TABLET | Freq: Four times a day (QID) | ORAL | Status: DC
  Administered 2017-03-12 – 2017-03-14 (×6): 600 mg via ORAL
  Filled 2017-03-12 (×6): qty 1

## 2017-03-12 MED ORDER — PRENATAL MULTIVITAMIN CH: 1.0000 | ORAL_TABLET | Freq: Every day | ORAL | Status: DC

## 2017-03-12 MED ORDER — SODIUM CHLORIDE 0.9% FLUSH
3.0000 mL | Freq: Two times a day (BID) | INTRAVENOUS | Status: DC
  Administered 2017-03-13: 3 mL via INTRAVENOUS

## 2017-03-12 MED ORDER — SOD CITRATE-CITRIC ACID 500-334 MG/5ML PO SOLN: 30.0000 mL | ORAL | Status: DC | PRN

## 2017-03-12 MED ORDER — FLEET ENEMA 7-19 GM/118ML RE ENEM: 1.0000 | ENEMA | Freq: Once | RECTAL | Status: DC

## 2017-03-12 MED ORDER — LIDOCAINE HCL (PF) 1 % IJ SOLN
30.0000 mL | INTRAMUSCULAR | Status: DC | PRN
  Filled 2017-03-12: qty 30

## 2017-03-12 MED ORDER — LACTATED RINGERS IV SOLN
INTRAVENOUS | Status: DC
  Administered 2017-03-12: 125 mL/h via INTRAVENOUS

## 2017-03-12 MED ORDER — DIBUCAINE 1 % RE OINT: 1.0000 "application " | TOPICAL_OINTMENT | RECTAL | Status: DC | PRN

## 2017-03-12 MED ORDER — SODIUM CHLORIDE 0.9 % IV SOLN: 250.0000 mL | INTRAVENOUS | Status: DC | PRN

## 2017-03-12 MED ORDER — OXYCODONE-ACETAMINOPHEN 5-325 MG PO TABS: 2.0000 | ORAL_TABLET | ORAL | Status: DC | PRN

## 2017-03-12 MED ORDER — SODIUM CHLORIDE 0.9% FLUSH: 3.0000 mL | INTRAVENOUS | Status: DC | PRN

## 2017-03-12 MED ORDER — OXYTOCIN 40 UNITS IN LACTATED RINGERS INFUSION - SIMPLE MED
2.5000 [IU]/h | INTRAVENOUS | Status: DC
  Filled 2017-03-12: qty 1000

## 2017-03-12 NOTE — Progress Notes (Signed)
Pt declined I&O catheterization. Asked to ambulate to bathroom to empty bladder. Voided successfully.

## 2017-03-12 NOTE — Progress Notes (Signed)
Two closed leisons noted on the interior portion of left foot  One leison 1.5 cm by 2.4 cms raised and 1.2 by 1.1 cms both leisons raised.  Noted by Nolon Lennert and S [phone] RN

## 2017-03-12 NOTE — H&P (Signed)
Beth Park is a 36 y.o. female presenting for active labor.. OB History    Gravida Para Term Preterm AB Living   5 3 3   1 3    SAB TAB Ectopic Multiple Live Births   1     0 3      Obstetric Comments   Eclampsia 1 week postpartum following first delivery     Past Medical History:  Diagnosis Date  . Pregnancy induced hypertension    Past Surgical History:  Procedure Laterality Date  . DILATION AND CURETTAGE OF UTERUS    . FOOT SURGERY Left    Family History: family history includes Cancer in her maternal grandfather; Hypertension in her mother; Stroke in her maternal grandmother. Social History:  reports that she has been smoking cigarettes.  She has been smoking about 0.25 packs per day. she has never used smokeless tobacco. She reports that she does not drink alcohol or use drugs.     Maternal Diabetes: No Genetic Screening: Normal Maternal Ultrasounds/Referrals: Normal Fetal Ultrasounds or other Referrals:  None Maternal Substance Abuse:  No Significant Maternal Medications:  None Significant Maternal Lab Results:  None Other Comments:  None  Review of Systems  Constitutional: Negative.   HENT: Negative.   Eyes: Negative.   Respiratory: Negative.   Cardiovascular: Negative.   Gastrointestinal: Positive for abdominal pain.  Genitourinary: Negative.   Musculoskeletal: Negative.   Skin: Negative.   Neurological: Negative.   Endo/Heme/Allergies: Negative.   Psychiatric/Behavioral: Negative.    Maternal Medical History:  Reason for admission: Contractions.   Contractions: Onset was 6-12 hours ago.   Frequency: regular.   Perceived severity is moderate.    Fetal activity: Perceived fetal activity is normal.   Last perceived fetal movement was within the past hour.    Prenatal complications: no prenatal complications Prenatal Complications - Diabetes: none.    Dilation: 8 Effacement (%): 90 Station: -1 Exam by:: n druebbisch rn Blood pressure (!)  145/97, pulse 90, temperature 98.1 F (36.7 C), temperature source Oral, resp. rate 20, last menstrual period 06/14/2016, unknown if currently breastfeeding. Maternal Exam:  Uterine Assessment: Contraction strength is moderate.  Contraction frequency is regular.   Abdomen: Patient reports no abdominal tenderness. Fetal presentation: vertex  Introitus: Normal vulva. Normal vagina.  Ferning test: not done.  Nitrazine test: not done. Amniotic fluid character: not assessed.  Pelvis: adequate for delivery.   Cervix: Cervix evaluated by digital exam.     Fetal Exam Fetal Monitor Review: Mode: ultrasound.   Variability: moderate (6-25 bpm).   Pattern: accelerations present.    Fetal State Assessment: Category I - tracings are normal.     Physical Exam  Constitutional: She is oriented to person, place, and time. She appears well-developed and well-nourished.  HENT:  Head: Normocephalic.  Eyes: Pupils are equal, round, and reactive to light.  Cardiovascular: Normal rate, regular rhythm, normal heart sounds and intact distal pulses.  Respiratory: Effort normal and breath sounds normal.  GI: Soft. Bowel sounds are normal.  Genitourinary: Vagina normal and uterus normal.  Musculoskeletal: Normal range of motion.  Neurological: She is alert and oriented to person, place, and time. She has normal reflexes.  Skin: Skin is warm and dry.  Psychiatric: She has a normal mood and affect. Her behavior is normal. Judgment and thought content normal.    Prenatal labs: ABO, Rh: O/Positive/-- (07/19 1017) Antibody: Negative (07/19 1017) Rubella: 3.10 (07/19 1017) RPR: Non Reactive (09/26 0943)  HBsAg: Negative (07/19 1017)  HIV:    GBS: Negative (11/29 1530)   Assessment/Plan: Active labor GBS neg Admit   Koren Shiver 03/12/2017, 3:05 PM

## 2017-03-12 NOTE — MAU Note (Signed)
Ctx since 10 am, no bleeding, no LOF, +FM

## 2017-03-13 ENCOUNTER — Inpatient Hospital Stay (HOSPITAL_COMMUNITY): Payer: Medicaid Other | Admitting: Certified Registered Nurse Anesthetist

## 2017-03-13 ENCOUNTER — Encounter (HOSPITAL_COMMUNITY)
Admission: AD | Disposition: A | Discharge status: Home / Self Care | Payer: Self-pay | Source: Ambulatory Visit | Attending: Obstetrics & Gynecology

## 2017-03-13 ENCOUNTER — Encounter: Payer: Self-pay | Admitting: *Deleted

## 2017-03-13 ENCOUNTER — Encounter (HOSPITAL_COMMUNITY): Payer: Self-pay

## 2017-03-13 DIAGNOSIS — Z302 Encounter for sterilization: Secondary | ICD-10-CM

## 2017-03-13 HISTORY — PX: TUBAL LIGATION: SHX77

## 2017-03-13 LAB — RPR: RPR Ser Ql: NONREACTIVE

## 2017-03-13 SURGERY — LIGATION, FALLOPIAN TUBE, POSTPARTUM
Anesthesia: Spinal | Laterality: Bilateral

## 2017-03-13 MED ORDER — NALBUPHINE HCL 10 MG/ML IJ SOLN: 5.0000 mg | INTRAMUSCULAR | Status: DC | PRN

## 2017-03-13 MED ORDER — KETOROLAC TROMETHAMINE 30 MG/ML IJ SOLN
INTRAMUSCULAR | Status: AC
  Filled 2017-03-13: qty 1

## 2017-03-13 MED ORDER — KETOROLAC TROMETHAMINE 30 MG/ML IJ SOLN
INTRAMUSCULAR | Status: DC | PRN
  Administered 2017-03-13: 30 mg via INTRAVENOUS

## 2017-03-13 MED ORDER — DIPHENHYDRAMINE HCL 50 MG/ML IJ SOLN: 12.5000 mg | INTRAMUSCULAR | Status: DC | PRN

## 2017-03-13 MED ORDER — MEPERIDINE HCL 25 MG/ML IJ SOLN: 6.2500 mg | INTRAMUSCULAR | Status: DC | PRN

## 2017-03-13 MED ORDER — NALOXONE HCL 0.4 MG/ML IJ SOLN: 1.0000 ug/kg/h | INTRAVENOUS | Status: DC | PRN

## 2017-03-13 MED ORDER — BUPIVACAINE HCL (PF) 0.5 % IJ SOLN
INTRAMUSCULAR | Status: AC
  Filled 2017-03-13: qty 30

## 2017-03-13 MED ORDER — NALOXONE HCL 0.4 MG/ML IJ SOLN: 0.4000 mg | INTRAMUSCULAR | Status: DC | PRN

## 2017-03-13 MED ORDER — ONDANSETRON HCL 4 MG/2ML IJ SOLN
INTRAMUSCULAR | Status: AC
  Filled 2017-03-13: qty 2

## 2017-03-13 MED ORDER — MIDAZOLAM HCL 2 MG/2ML IJ SOLN
INTRAMUSCULAR | Status: AC
  Filled 2017-03-13: qty 2

## 2017-03-13 MED ORDER — ONDANSETRON HCL 4 MG/2ML IJ SOLN: 4.0000 mg | Freq: Three times a day (TID) | INTRAMUSCULAR | Status: DC | PRN

## 2017-03-13 MED ORDER — NALBUPHINE HCL 10 MG/ML IJ SOLN: 5.0000 mg | Freq: Once | INTRAMUSCULAR | Status: DC | PRN

## 2017-03-13 MED ORDER — FAMOTIDINE 20 MG PO TABS
40.0000 mg | ORAL_TABLET | Freq: Once | ORAL | Status: AC
  Administered 2017-03-13: 40 mg via ORAL
  Filled 2017-03-13: qty 2

## 2017-03-13 MED ORDER — BUPIVACAINE HCL (PF) 0.5 % IJ SOLN
INTRAMUSCULAR | Status: DC | PRN
  Administered 2017-03-13: 30 mL

## 2017-03-13 MED ORDER — ONDANSETRON HCL 4 MG/2ML IJ SOLN
INTRAMUSCULAR | Status: DC | PRN
  Administered 2017-03-13: 4 mg via INTRAVENOUS

## 2017-03-13 MED ORDER — DIPHENHYDRAMINE HCL 25 MG PO CAPS: 25.0000 mg | ORAL_CAPSULE | ORAL | Status: DC | PRN

## 2017-03-13 MED ORDER — FENTANYL CITRATE (PF) 100 MCG/2ML IJ SOLN: 25.0000 ug | INTRAMUSCULAR | Status: DC | PRN

## 2017-03-13 MED ORDER — SODIUM CHLORIDE 0.9 % IR SOLN
Status: DC | PRN
  Administered 2017-03-13: 1

## 2017-03-13 MED ORDER — FENTANYL CITRATE (PF) 100 MCG/2ML IJ SOLN
INTRAMUSCULAR | Status: AC
  Filled 2017-03-13: qty 2

## 2017-03-13 MED ORDER — SODIUM CHLORIDE 0.9% FLUSH: 3.0000 mL | INTRAVENOUS | Status: DC | PRN

## 2017-03-13 MED ORDER — LACTATED RINGERS IV SOLN
INTRAVENOUS | Status: DC | PRN
  Administered 2017-03-13 (×2): via INTRAVENOUS

## 2017-03-13 MED ORDER — SCOPOLAMINE 1 MG/3DAYS TD PT72: 1.0000 | MEDICATED_PATCH | Freq: Once | TRANSDERMAL | Status: DC

## 2017-03-13 MED ORDER — PHENYLEPHRINE 8 MG IN D5W 100 ML (0.08MG/ML) PREMIX OPTIME
INJECTION | INTRAVENOUS | Status: AC
  Filled 2017-03-13: qty 100

## 2017-03-13 MED ORDER — METOCLOPRAMIDE HCL 10 MG PO TABS
10.0000 mg | ORAL_TABLET | Freq: Once | ORAL | Status: AC
  Administered 2017-03-13: 10 mg via ORAL
  Filled 2017-03-13: qty 1

## 2017-03-13 MED ORDER — LACTATED RINGERS IV SOLN
INTRAVENOUS | Status: DC
  Administered 2017-03-13: 12:00:00 via INTRAVENOUS

## 2017-03-13 SURGICAL SUPPLY — 26 items
APL SKNCLS STERI-STRIP NONHPOA (GAUZE/BANDAGES/DRESSINGS)
BENZOIN TINCTURE PRP APPL 2/3 (GAUZE/BANDAGES/DRESSINGS) IMPLANT
BLADE SURG 11 STRL SS (BLADE) ×3 IMPLANT
CLIP FILSHIE TUBAL LIGA STRL (Clip) ×3 IMPLANT
CLOTH BEACON ORANGE TIMEOUT ST (SAFETY) ×3 IMPLANT
DRSG OPSITE POSTOP 3X4 (GAUZE/BANDAGES/DRESSINGS) ×3 IMPLANT
DURAPREP 26ML APPLICATOR (WOUND CARE) ×3 IMPLANT
ELECT REM PT RETURN 9FT ADLT (ELECTROSURGICAL) ×3
ELECTRODE REM PT RTRN 9FT ADLT (ELECTROSURGICAL) ×1 IMPLANT
GLOVE BIOGEL PI IND STRL 7.0 (GLOVE) ×3 IMPLANT
GLOVE BIOGEL PI INDICATOR 7.0 (GLOVE) ×6
GLOVE ECLIPSE 7.0 STRL STRAW (GLOVE) ×3 IMPLANT
GOWN STRL REUS W/TWL LRG LVL3 (GOWN DISPOSABLE) ×6 IMPLANT
NEEDLE HYPO 22GX1.5 SAFETY (NEEDLE) ×3 IMPLANT
NS IRRIG 1000ML POUR BTL (IV SOLUTION) ×3 IMPLANT
PACK ABDOMINAL MINOR (CUSTOM PROCEDURE TRAY) ×3 IMPLANT
PENCIL BUTTON HOLSTER BLD 10FT (ELECTRODE) ×3 IMPLANT
PROTECTOR NERVE ULNAR (MISCELLANEOUS) ×3 IMPLANT
SPONGE LAP 4X18 X RAY DECT (DISPOSABLE) IMPLANT
SUT VIC AB 0 CT1 27 (SUTURE) ×3
SUT VIC AB 0 CT1 27XBRD ANBCTR (SUTURE) ×1 IMPLANT
SUT VICRYL 4-0 PS2 18IN ABS (SUTURE) ×3 IMPLANT
SYR CONTROL 10ML LL (SYRINGE) ×3 IMPLANT
TOWEL OR 17X24 6PK STRL BLUE (TOWEL DISPOSABLE) ×6 IMPLANT
TRAY FOLEY CATH SILVER 14FR (SET/KITS/TRAYS/PACK) ×3 IMPLANT
WATER STERILE IRR 1000ML POUR (IV SOLUTION) ×3 IMPLANT

## 2017-03-13 NOTE — Anesthesia Preprocedure Evaluation (Signed)
Anesthesia Evaluation  Patient identified by MRN, date of birth, ID band Patient awake    Reviewed: Allergy & Precautions, H&P , Patient's Chart, lab work & pertinent test results  Airway Mallampati: II  TM Distance: >3 FB Neck ROM: full    Dental no notable dental hx.    Pulmonary Current Smoker,    Pulmonary exam normal breath sounds clear to auscultation       Cardiovascular Exercise Tolerance: Good hypertension,  Rhythm:regular Rate:Normal     Neuro/Psych    GI/Hepatic   Endo/Other    Renal/GU      Musculoskeletal   Abdominal   Peds  Hematology   Anesthesia Other Findings   Reproductive/Obstetrics                             Anesthesia Physical Anesthesia Plan  ASA: III  Anesthesia Plan: Spinal   Post-op Pain Management:    Induction:   PONV Risk Score and Plan:   Airway Management Planned:   Additional Equipment:   Intra-op Plan:   Post-operative Plan:   Informed Consent: I have reviewed the patients History and Physical, chart, labs and discussed the procedure including the risks, benefits and alternatives for the proposed anesthesia with the patient or authorized representative who has indicated his/her understanding and acceptance.     Plan Discussed with:   Anesthesia Plan Comments: (  )        Anesthesia Quick Evaluation

## 2017-03-13 NOTE — Progress Notes (Signed)
Dr Harolyn Rutherford called and asked if O2 sat orders are to be done as ordered.  Anesthesia MD called and was OK to d/c orders Q 1 hour

## 2017-03-13 NOTE — Progress Notes (Signed)
POSTPARTUM PROGRESS NOTE  Post Partum Day 1 Subjective:  Beth Park is a 36 y.o. [id number] [redacted]w[redacted]d s/p SVD.  No acute events overnight.  Pt denies problems with ambulating, voiding or po intake.  She denies nausea or vomiting.  Pain is well controlled.  She has had flatus. She has not had bowel movement.  Lochia Minimal.   Her main complaint is the NPO period leading into her tubal.  Objective: Blood pressure 106/65, pulse 87, temperature 98.6 F (37 C), temperature source Oral, resp. rate 18, height 5\' 7"  (1.702 m), weight 114.8 kg (253 lb), last menstrual period 06/14/2016, SpO2 100 %, unknown if currently breastfeeding.  Physical Exam:  General: alert, cooperative and no distress Lochia:normal flow Chest: no respiratory distress Heart:regular rate, distal pulses intact Abdomen: soft, nontender,  Uterine Fundus: firm, appropriately tender DVT Evaluation: No calf swelling or tenderness Extremities: no edema  Recent Labs    03/12/17 1526  HGB 16.5*  HCT 48.5*    Assessment/Plan:  ASSESSMENT: Beth Park is a 36 y.o. [id number] [redacted]w[redacted]d s/p SVD  Plan for discharge tomorrow, BTL planned today   LOS: 1 day   Quantel Mcinturff BlandDO 03/13/2017, 9:09 AM

## 2017-03-13 NOTE — Transfer of Care (Signed)
Immediate Anesthesia Transfer of Care Note  Patient: Beth Park  Procedure(s) Performed: POST PARTUM TUBAL LIGATION  Patient DOES NOT have epidural (Bilateral )  Patient Location: PACU  Anesthesia Type:Spinal  Level of Consciousness: awake, alert  and oriented  Airway & Oxygen Therapy: Patient Spontanous Breathing  Post-op Assessment: Report given to RN and Post -op Vital signs reviewed and stable  Post vital signs: Reviewed and stable  Last Vitals:  Vitals:   03/13/17 0851 03/13/17 1115  BP: 106/65 115/74  Pulse: 87 77  Resp: 18 18  Temp: 37 C 36.8 C  SpO2: 100% 100%    Last Pain:  Vitals:   03/13/17 1115  TempSrc: Oral  PainSc:          Complications: No apparent anesthesia complications

## 2017-03-13 NOTE — Anesthesia Procedure Notes (Signed)
Spinal  Patient location during procedure: OR Staffing Anesthesiologist: Lyndle Herrlich, MD Spinal Block Patient position: sitting Prep: DuraPrep Patient monitoring: heart rate, blood pressure and continuous pulse ox Approach: right paramedian Location: L3-4 Injection technique: single-shot Needle Needle type: Sprotte  Needle gauge: 24 G Needle length: 9 cm Assessment Sensory level: T4 Additional Notes Spinal Dosage in OR  .75% Bupivicaine ml       1.6

## 2017-03-13 NOTE — Op Note (Signed)
Beth Park 03/12/2017 - 03/13/2017  PREOPERATIVE DIAGNOSES: Multiparity, undesired fertility  POSTOPERATIVE DIAGNOSES: Multiparity, undesired fertility  PROCEDURE:  Postpartum Bilateral Tubal Sterilization using Filshie Clips   SURGEON: Dr.  Verita Schneiders  ANESTHESIA:  Epidural and local analgesia using 30 ml of 8.1% Marcaine  COMPLICATIONS:  None immediate.  ESTIMATED BLOOD LOSS: 10 ml.  FLUIDS: 1100 ml LR.  URINE OUTPUT:  100 ml of clear urine.  INDICATIONS:  36 y.o. [id number] with undesired fertility,status post vaginal delivery, desires permanent sterilization.  Other reversible forms of contraception were discussed with patient; she declines all other modalities. Risks of procedure discussed with patient including but not limited to: risk of regret, permanence of method, bleeding, infection, injury to surrounding organs and need for additional procedures.  Failure risk of 1 -2 % with increased risk of ectopic gestation if pregnancy occurs was also discussed with patient.      FINDINGS:  Normal uterus, tubes, and ovaries.  PROCEDURE DETAILS: The patient was taken to the operating room where her epidural anesthesia was dosed up to surgical level and found to be adequate.  She was then placed in the dorsal supine position and prepped and draped in sterile fashion.  After an adequate timeout was performed, attention was turned to the patient's abdomen where a small transverse skin incision was made under the umbilical fold. The incision was taken down to the layer of fascia using the scalpel, and fascia was incised, and extended bilaterally using Mayo scissors. The peritoneum was entered in a sharp fashion. Attention was then turned to the patient's uterus, and left fallopian tube was identified and followed out to the fimbriated end.  A Filshie clip was placed on the left fallopian tube about 3 cm from the cornual attachment, with care given to incorporate the underlying mesosalpinx.   A similar process was carried out on the right side allowing for bilateral tubal sterilization.  Good hemostasis was noted overall.  Local analgesia was injected into both Filshie application sites.The instruments were then removed from the patient's abdomen and the fascial incision was repaired with 0 Vicryl, and the skin was closed with a 4-0 Vicryl subcuticular stitch. The patient tolerated the procedure well.  Instrument, sponge, and needle counts were correct times two.  The patient was then taken to the recovery room awake and in stable condition.   Verita Schneiders, MD, [place] Attending [place], [facility]

## 2017-03-13 NOTE — Progress Notes (Signed)
Dr Criss Rosales notified of 200 cc urinary output.  Foley d/ced as ordered.  Pt encouraged to drink

## 2017-03-14 ENCOUNTER — Encounter (HOSPITAL_COMMUNITY): Payer: Self-pay | Admitting: *Deleted

## 2017-03-14 ENCOUNTER — Telehealth: Payer: Self-pay | Admitting: Radiology

## 2017-03-14 ENCOUNTER — Encounter (HOSPITAL_COMMUNITY): Payer: Self-pay

## 2017-03-14 DIAGNOSIS — Z3009 Encounter for other general counseling and advice on contraception: Secondary | ICD-10-CM

## 2017-03-14 LAB — BIRTH TISSUE RECOVERY COLLECTION (PLACENTA DONATION)

## 2017-03-14 MED ORDER — IBUPROFEN 600 MG PO TABS: 600.0000 mg | ORAL_TABLET | Freq: Four times a day (QID) | ORAL | 0 refills | Status: DC

## 2017-03-14 MED ORDER — OXYCODONE HCL 5 MG PO TABS
5.0000 mg | ORAL_TABLET | Freq: Four times a day (QID) | ORAL | Status: DC | PRN
  Administered 2017-03-14 (×2): 5 mg via ORAL
  Filled 2017-03-14 (×2): qty 1

## 2017-03-14 MED ORDER — OXYCODONE HCL 5 MG PO TABS: 5.0000 mg | ORAL_TABLET | Freq: Four times a day (QID) | ORAL | 0 refills | Status: DC | PRN

## 2017-03-14 NOTE — Anesthesia Postprocedure Evaluation (Signed)
Anesthesia Post Note  Patient: Beth Park  Procedure(s) Performed: POST PARTUM TUBAL LIGATION  Patient DOES NOT have epidural (Bilateral )     Patient location during evaluation: PACU Anesthesia Type: Spinal Level of consciousness: awake Pain management: satisfactory to patient Vital Signs Assessment: post-procedure vital signs reviewed and stable Respiratory status: spontaneous breathing Cardiovascular status: blood pressure returned to baseline Postop Assessment: no headache and spinal receding Anesthetic complications: no    Last Vitals:  Vitals:   03/14/17 0100 03/14/17 0506  BP: 122/74 115/75  Pulse: 79 83  Resp: 18 12  Temp: 36.7 C 36.6 C  SpO2:  100%    Last Pain:  Vitals:   03/14/17 0715  TempSrc:   PainSc: Asleep                 Glennys Schorsch EDWARD

## 2017-03-14 NOTE — Telephone Encounter (Signed)
Left message on the cell phone voicemail that postpartum appointment is scheduled for 04/24/17 @ 1:15 with Dr Harolyn Rutherford. Instructed to call [id number] if needed to change.

## 2017-03-14 NOTE — Discharge Summary (Signed)
OB Discharge Summary     Patient Name: Beth Park DOB: 1980/05/17 MRN: [id number]  Date of admission: 03/12/2017 Delivering MD: Daiva Nakayama   Date of discharge: 03/14/2017  Admitting diagnosis: CTXS Intrauterine pregnancy: [redacted]w[redacted]d     Secondary diagnosis:  Principal Problem:   SVD (spontaneous vaginal delivery) Active Problems:   History of eclampsia   Obesity, Class II, BMI 35-39.9   Tobacco use affecting pregnancy, antepartum   Pregnancy   Unwanted fertility  Additional problems: tobacco use during pregnancy Advanced maternal age obesity     Discharge diagnosis: Term Pregnancy Delivered                                                                                                Post partum procedures:postpartum tubal ligation  Augmentation: Pitocin  Complications: None  Hospital course:  Spontaneous onset Labor With Vaginal Delivery   36 y.o. yo [id number] at [redacted]w[redacted]d was admitted to the hospital 03/12/2017 for onset of labor.   Patient had an uncomplicated labor course as follows: Membrane Rupture Time/Date: 3:15 PM ,03/12/2017   Intrapartum Procedures: Episiotomy: None [1]                                         Lacerations:  None [1]  Patient had delivery of a Viable infant.  Information for the patient's newborn:  Courtnie, Brenes [[id number]]  Delivery Method: Vaginal, Spontaneous(Filed from Delivery Summary)   03/12/2017  Details of delivery can be found in separate delivery note.  Patient had a routine postpartum course. Patient is discharged home 03/14/17.  Physical exam  Vitals:   03/13/17 1749 03/13/17 2100 03/14/17 0100 03/14/17 0506  BP: 121/75 133/84 122/74 115/75  Pulse: 88 81 79 83  Resp: 18 18 18 12   Temp: 98.2 F (36.8 C) 98 F (36.7 C) 98.1 F (36.7 C) 97.9 F (36.6 C)  TempSrc: Oral Oral Oral Axillary  SpO2: 100% 100%  100%  Weight:    241 lb 9.6 oz (109.6 kg)  Height:       General: alert, cooperative and no distress Lochia:  appropriate Uterine Fundus: firm Incision: Healing well with no significant drainage DVT Evaluation: No evidence of DVT seen on physical exam. Labs: Lab Results  Component Value Date   WBC 8.5 03/12/2017   HGB 16.5 (H) 03/12/2017   HCT 48.5 (H) 03/12/2017   MCV 85.5 03/12/2017   PLT 155 03/12/2017   CMP Latest Ref Rng & Units 10/13/2016  Glucose 65 - 99 mg/dL 122(H)  BUN 6 - 20 mg/dL 10  Creatinine 0.57 - 1.00 mg/dL 0.55(L)  Sodium 134 - 144 mmol/L 137  Potassium 3.5 - 5.2 mmol/L 3.7  Chloride 96 - 106 mmol/L 105  CO2 20 - 29 mmol/L 17(L)  Calcium 8.7 - 10.2 mg/dL 9.2  Total Protein 6.0 - 8.5 g/dL 6.1  Total Bilirubin 0.0 - 1.2 mg/dL <0.2  Alkaline Phos 39 - 117 IU/L 52  AST 0 - 40 IU/L 14  ALT  0 - 32 IU/L 10    Discharge instruction: per After Visit Summary and "Baby and Me Booklet".  After visit meds:  Allergies as of 03/14/2017   No Known Allergies     Medication List    TAKE these medications   aspirin EC 81 MG tablet Take 1 tablet (81 mg total) by mouth daily. Take after 12 weeks for prevention of preeclampsia later in pregnancy   ibuprofen 600 MG tablet Commonly known as:  ADVIL,MOTRIN Take 1 tablet (600 mg total) by mouth every 6 (six) hours.   oxyCODONE 5 MG immediate release tablet Commonly known as:  Oxy IR/ROXICODONE Take 1 tablet (5 mg total) by mouth every 6 (six) hours as needed for moderate pain or severe pain.   PREPLUS 27-1 MG Tabs Take 1 tablet by mouth daily.       Diet: routine diet  Activity: Advance as tolerated. Pelvic rest for 6 weeks.   Outpatient follow up:4weeks Follow up Appt: Future Appointments  Date Time Provider [place]  04/24/2017  1:15 PM Anyanwu, Sallyanne Havers, MD [facility]   Follow up Visit:No Follow-up on file.  Postpartum contraception: Tubal Ligation  Newborn Data: Live born female  Birth Weight: 6 lb 7.2 oz (2925 g) APGAR: 59, 9  Newborn Delivery   Birth date/time:  03/12/2017  15:54:00 Delivery type:  Vaginal, Spontaneous     Baby Feeding: Breast Disposition:home with mother   03/14/2017 Gailen Shelter, MD

## 2017-03-14 NOTE — Progress Notes (Signed)
CSW received consult due to score 15 on Edinburgh Depression Screen.    When CSW arrived, MOB was preparing for d/c as evidence by MOB gathering and packing MOB's belongings. FOB was also present and with MOB's permission, CSW asked FOB to leave in effort for CSW to meet with MOB in private. CSW explained CSW's role and encouraged MOB to ask questions. CSW reviewed MOB's responses to the EDPS. MOB communicated, "I was just not prepared to have my baby this early.  I thought I had about another week to work and prepare for my baby." MOB stated infant to be born in another week and due to being unprepared, MOB has felt overwhelmed.    CSW provided education regarding Baby Blues vs PMADs and provided MOB with information about support groups held at [facility] encouraged MOB to evaluate her mental health throughout the postpartum period with the use of the New Mom Checklist developed by Postpartum [facility] and notify a medical professional if symptoms arise.  MOB denied PPD with MOB's oldest children and denied SI, HI, and DV. MOB did not present with any acute symptoms and declined resources for outpatient counseling.   MOB expressed feeling better after being able to purchase a car seat and obtain a baby box (CSW observed items in MOB's room).   There are no barriers to d/c.  Laurey Arrow, MSW, LCSW Clinical Social Work [phone]

## 2017-03-14 NOTE — Progress Notes (Signed)
Dr Criss Rosales contacted to request orders for post-op pain control. Oxycodone 5MG  PRN Q6 ordered

## 2017-03-14 NOTE — Addendum Note (Signed)
Addendum  created 03/14/17 0749 by Riki Sheer, CRNA   Sign clinical note

## 2017-03-14 NOTE — Anesthesia Postprocedure Evaluation (Signed)
Anesthesia Post Note  Patient: Beth Park  Procedure(s) Performed: POST PARTUM TUBAL LIGATION  Patient DOES NOT have epidural (Bilateral )     Patient location during evaluation: Mother Baby Anesthesia Type: Spinal Level of consciousness: oriented and awake and alert Pain management: pain level controlled Vital Signs Assessment: post-procedure vital signs reviewed and stable Respiratory status: spontaneous breathing and respiratory function stable Cardiovascular status: blood pressure returned to baseline and stable Postop Assessment: no headache, no backache and no apparent nausea or vomiting Anesthetic complications: no    Last Vitals:  Vitals:   03/14/17 0100 03/14/17 0506  BP: 122/74 115/75  Pulse: 79 83  Resp: 18 12  Temp: 36.7 C 36.6 C  SpO2:  100%    Last Pain:  Vitals:   03/14/17 0715  TempSrc:   PainSc: Asleep   Pain Goal:                 Riki Sheer

## 2017-03-15 ENCOUNTER — Ambulatory Visit (HOSPITAL_COMMUNITY): Admission: RE | Admit: 2017-03-15 | Payer: Medicaid Other | Source: Ambulatory Visit

## 2017-03-17 ENCOUNTER — Encounter: Payer: Medicaid Other | Admitting: Obstetrics & Gynecology

## 2017-04-24 ENCOUNTER — Ambulatory Visit (INDEPENDENT_AMBULATORY_CARE_PROVIDER_SITE_OTHER): Payer: Medicaid Other | Admitting: Obstetrics & Gynecology

## 2017-04-24 ENCOUNTER — Encounter: Payer: Self-pay | Admitting: Obstetrics & Gynecology

## 2017-04-24 ENCOUNTER — Encounter: Payer: Self-pay | Admitting: Radiology

## 2017-04-24 DIAGNOSIS — Z1389 Encounter for screening for other disorder: Secondary | ICD-10-CM

## 2017-04-24 DIAGNOSIS — I10 Essential (primary) hypertension: Secondary | ICD-10-CM | POA: Insufficient documentation

## 2017-04-24 MED ORDER — AMLODIPINE BESYLATE 5 MG PO TABS
5.0000 mg | ORAL_TABLET | Freq: Every day | ORAL | 3 refills | Status: DC
Start: 2017-04-24 — End: 2023-06-19

## 2017-04-24 NOTE — Addendum Note (Signed)
Addended by: Phillip Heal, Adaleigh Warf A on: 04/24/2017 02:51 PM   Modules accepted: Orders

## 2017-04-24 NOTE — Progress Notes (Signed)
Pt BP elevated on initial intake - 160/109 Retake - 175/128 Per Dr Harolyn Rutherford sent in Norvasc 5mg  qd and referred pt to PCP for BP management.

## 2017-04-24 NOTE — Progress Notes (Signed)
Post Partum Exam  Beth Park is a 37 y.o. [id number] female who presents for a postpartum visit. She is 6 weeks postpartum following a spontaneous vaginal delivery. I have fully reviewed the prenatal and intrapartum course. The delivery was at 38.5 gestational weeks.  Anesthesia: none. Postpartum course has been unremarkable. Baby's course has been unremarkable. Baby is feeding by bottle D[employer] Soothing. Bleeding no bleeding. Bowel function is normal. Bladder function is normal. Patient is sexually active. Contraception method is tubal ligation. Postpartum depression screening: Score = 4 - Negative  The following portions of the patient's history were reviewed and updated as appropriate: allergies, current medications, past family history, past medical history, past social history, past surgical history and problem list. Last pap smear done 10/13/2016 and was Normal.  Review of Systems Pertinent items noted in HPI and remainder of comprehensive ROS otherwise negative.    Objective:  Blood pressure (!) 175/128, pulse 70, weight 242 lb (109.8 kg), last menstrual period 04/17/2017, not currently breastfeeding. BP Readings from Last 3 Encounters:  04/24/17 (!) 175/128 (30 mins after initial one of 160/109)  03/14/17 115/75  03/09/17 116/80    General:  alert and no distress   Breasts:  deferred  Lungs: clear to auscultation bilaterally  Heart:  regular rate and rhythm  Abdomen: soft, non-tender; bowel sounds normal; no masses,  no organomegaly   Pelvic:  not evaluated        Assessment:   Normal postpartum exam. Pap smear not done at today's visit.   Plan:   1. Contraception: tubal ligation 2. HTN: No symptoms. Norvasc 5mg  daily prescribed. Needs to follow up with PCP. 3. Follow up as needed.   Verita Schneiders, MD, [place] [facility] [employer], [place]

## 2019-01-09 IMAGING — US US OB COMP LESS 14 WK
1 series · 15 of 28 positions shown · non-contrast
Comparison: Pelvic ultrasound 09/05/2014

CLINICAL DATA: Pregnant patient with abdominal pain.

EXAM:
OBSTETRIC <14 WK ULTRASOUND
TECHNIQUE: Transabdominal ultrasound was performed for evaluation of the
gestation as well as the maternal uterus and adnexal regions.

[Series 1: us ob comp less 14 wk · 15 of 32 slices shown]
[im 1/32]
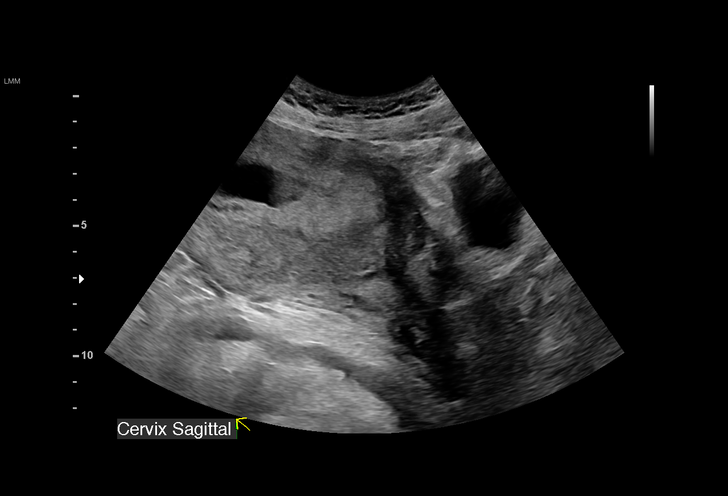
[im 3/32]
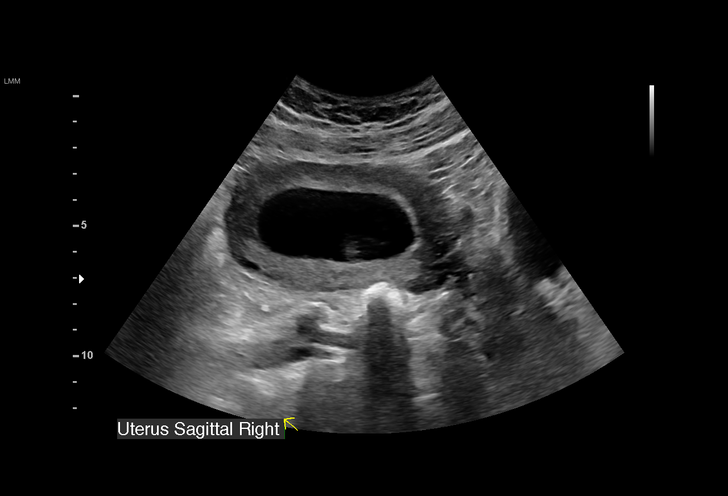
[im 5/32]
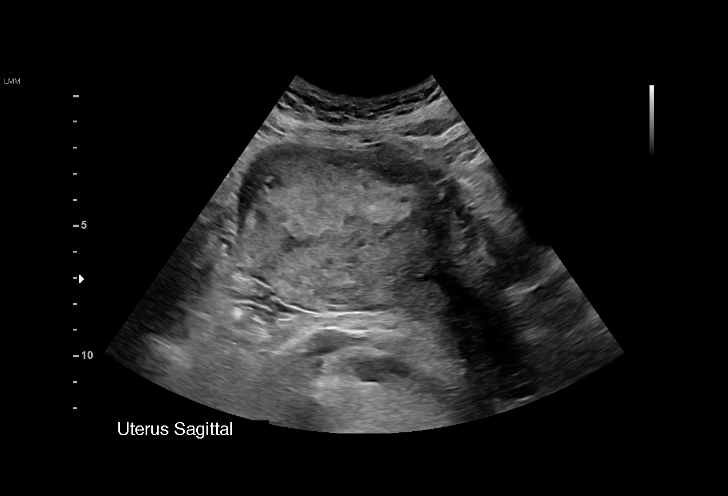
[im 7/32]
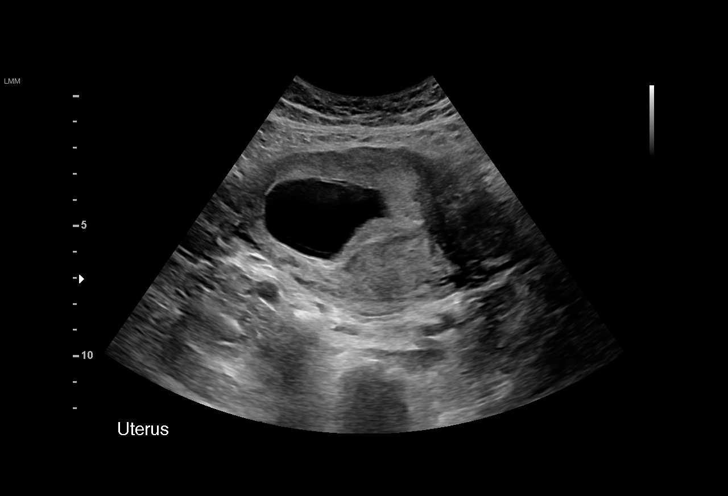
[im 10/32]
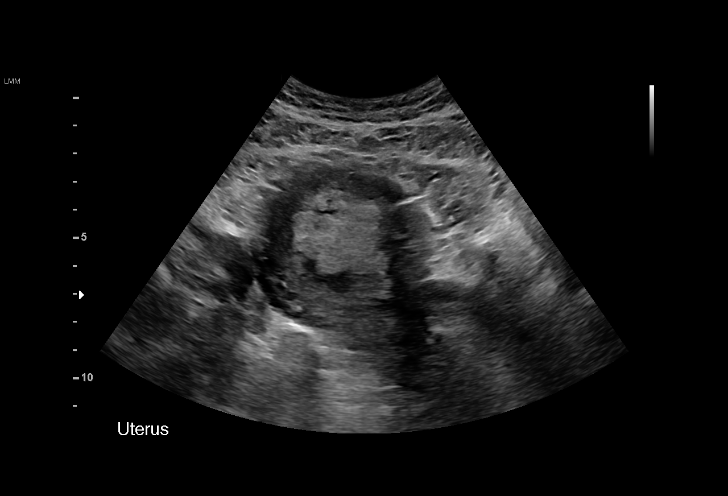
[im 12/32]
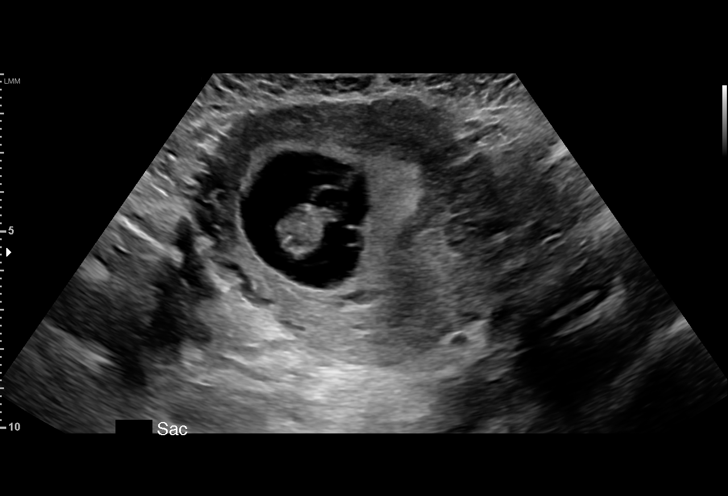
[im 14/32]
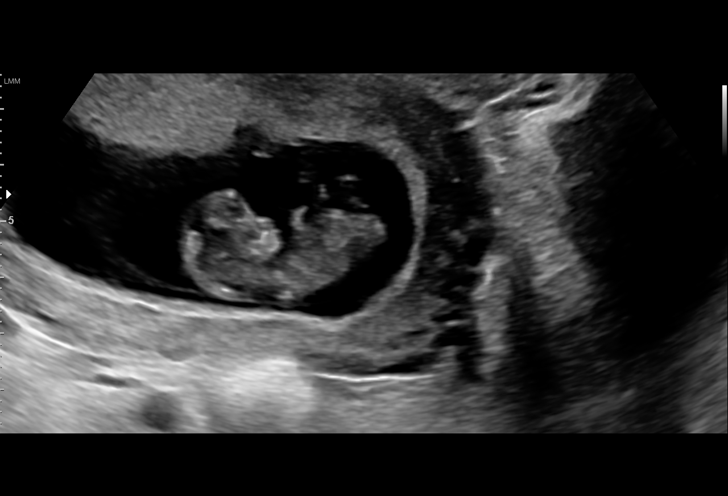
[im 17/32]
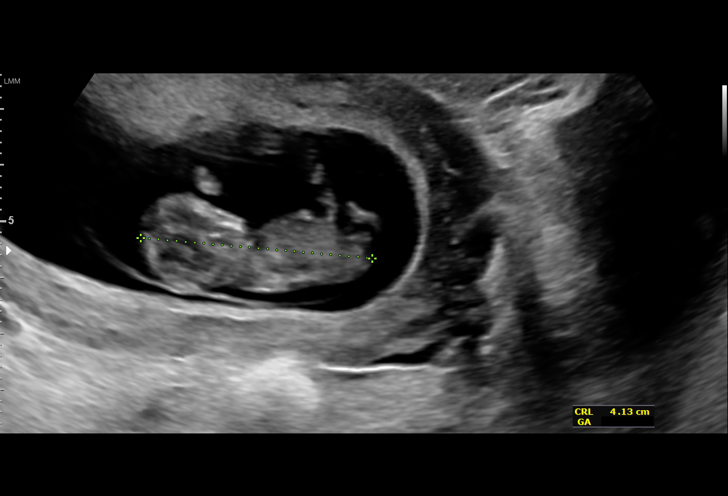
[im 18/32]
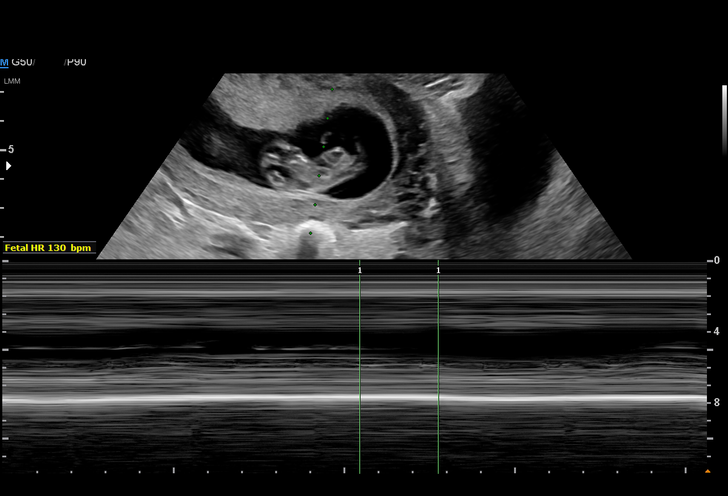
[im 20/32]
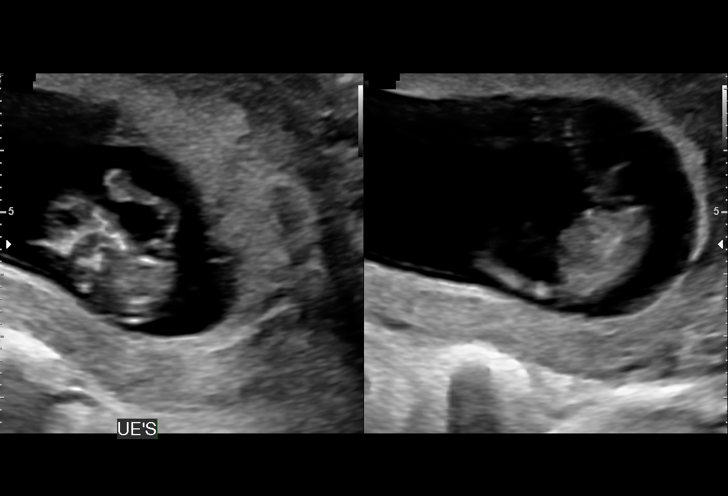
[im 22/32]
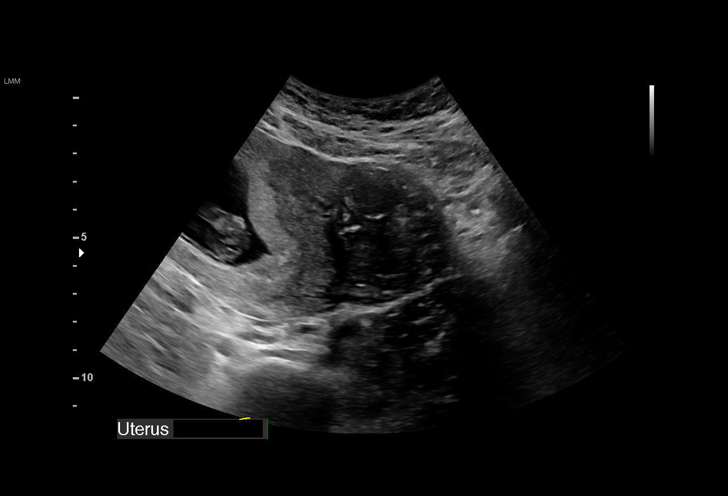
[im 25/32]
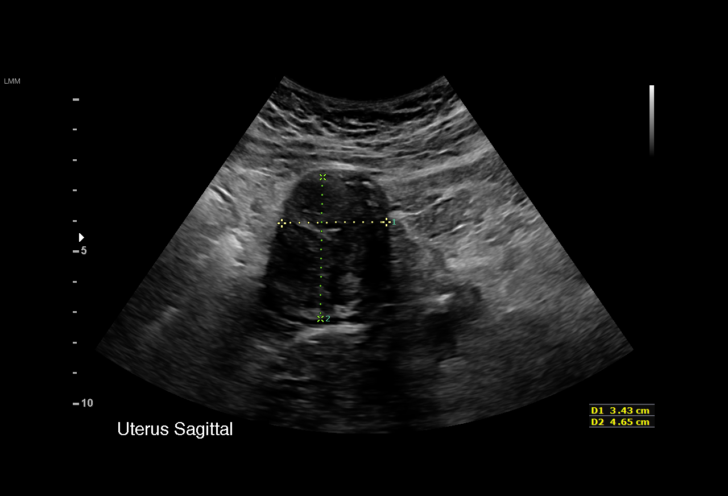
[im 27/32]
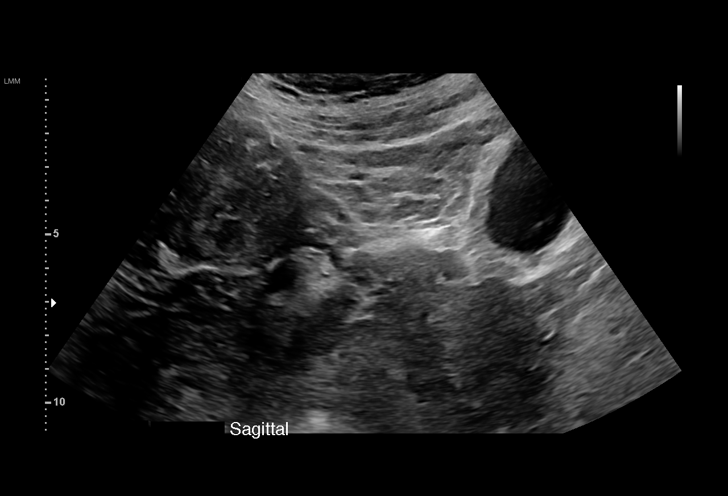
[im 29/32]
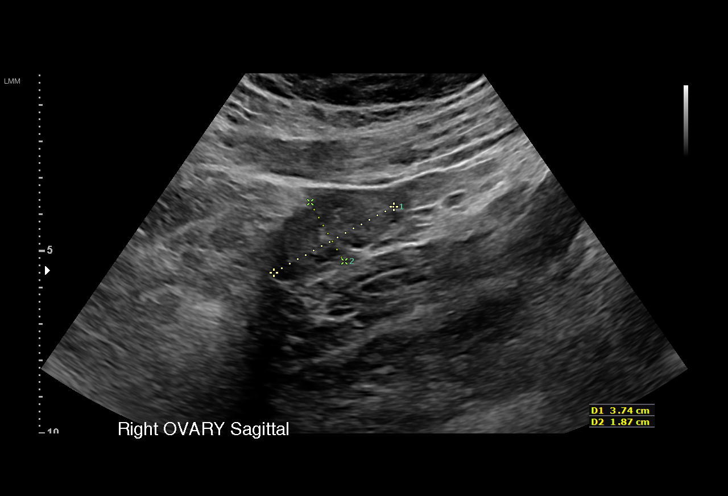
[im 32/32]
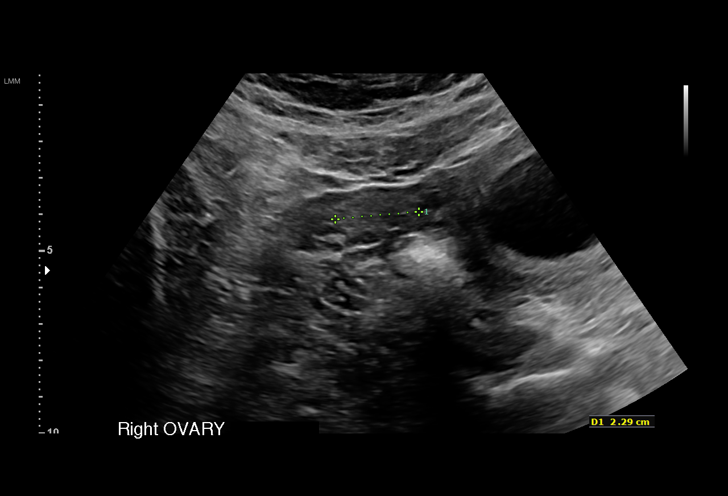

[15 of 28 positions shown; findings below may reference images not displayed]

FINDINGS: Intrauterine gestational sac: Single

Yolk sac:  Visualized.

Embryo:  Visualized.

Cardiac Activity: Visualized.

Heart Rate: 130 bpm

CRL:   38  mm   10 w for d                  US EDC: 03/22/2017

Subchorionic hemorrhage:  None visualized.

Maternal uterus/adnexae: The left ovary is not visualized. The right
ovary is normal. There is a 3.4 x 4.7 x 4.4 cm left fibroid within
the left aspect of the uterine fundus.
IMPRESSION: Single live intrauterine gestation.  No acute process.

Fibroid uterus.

## 2019-03-08 IMAGING — US US MFM OB DETAIL+14 WK
1 series · 13 of 28 positions shown · non-contrast
Comparison: none

Indications

19 weeks gestation of pregnancy
Encounter for antenatal screening for
malformations
Poor obstetric history: Previous  eclampsia
w/ 1st pregnancy
Obesity complicating pregnancy, second
trimester(pre-pregnancy BMI 34.5)
Advanced maternal age multigravida 36,
second trimester (low risk NIPS)
Abnormal biochemical screen (elevated
inhibin M.TJWoW)
OB History
Blood Type:            Height:  5'7"   Weight (lb):  236      BMI:
Gravidity:    5         Term:   3        Prem:   0        SAB:   1
TOP:          0       Ectopic:  0        Living: 3
Fetal Evaluation
Num Of Fetuses:     1
Fetal Heart         143
Rate(bpm):
Cardiac Activity:   Observed
Presentation:       Cephalic
Placenta:           Anterior, above cervical os
P. Cord Insertion:  Visualized
Amniotic Fluid
AFI FV:      Subjectively within normal limits
Largest Pocket(cm)
4.89
Biometry
BPD:      43.3  mm     G. Age:  19w 1d         55  %    CI:        78.03   %   70 - 86
FL/HC:      17.9   %   16.1 -
HC:      155.1  mm     G. Age:  18w 3d         19  %    HC/AC:      1.11       1.09 -
AC:      139.3  mm     G. Age:  19w 2d         57  %    FL/BPD:     64.2   %
FL:       27.8  mm     G. Age:  18w 4d         26  %    FL/AC:      20.0   %   20 - 24
HUM:      30.2  mm     G. Age:  20w 0d         75  %
CER:      20.2  mm     G. Age:  19w 1d         55  %
NFT:       5.1  mm
CM:        4.8  mm
Est. FW:     263  gm      0 lb 9 oz     42  %
Gestational Age
LMP:           19w 0d       Date:   06/14/16                 EDD:   03/21/17
U/S Today:     18w 6d                                        EDD:   03/22/17
Best:          19w 0d    Det. By:   LMP  (06/14/16)          EDD:   03/21/17
Anatomy
Cranium:               Appears normal         LVOT:                   Appears normal
Cavum:                 Appears normal         Aortic Arch:            Appears normal
Ventricles:            Appears normal         Ductal Arch:            Appears normal
Choroid Plexus:        Appears normal         Diaphragm:              Appears normal
Cerebellum:            Appears normal         Stomach:                Appears normal, left
sided
Posterior Fossa:       Appears normal         Abdomen:                Appears normal
Nuchal Fold:           Appears normal         Abdominal Wall:         Appears nml (cord
insert, abd wall)
Face:                  Appears normal         Cord Vessels:           Appears normal (3
(orbits and profile)                           vessel cord)
Lips:                  Appears normal         Kidneys:                Appear normal
Palate:                Appears normal         Bladder:                Appears normal
Thoracic:              Appears normal         Spine:                  Limited views
appear normal
Heart:                 Appears normal         Upper Extremities:      Appears normal
(4CH, axis, and situs
RVOT:                  Appears normal         Lower Extremities:      Appears normal
Other:  Male gender. Heels and Left 5th digit visualized. Technically difficult
due to maternal habitus and fetal position.
Cervix Uterus Adnexa
Cervix
Length:           3.33  cm.
Normal appearance by transabdominal scan.
Uterus
Normal shape and size.
Left Ovary
Not visualized. No adnexal mass visualized.
Right Ovary
Cul De Sac:   No free fluid seen.
Adnexa:       No abnormality visualized.
Impression
INDICATION: 36 yr old [id number] at 30w3d with elevated inhibin
of M.TJWoW and history of eclampsia for fetal anatomic
survey.

[Series 1: us mfm ob detail+14 wk · 84 acquisitions, 13 frames shown]
[im 4/84]
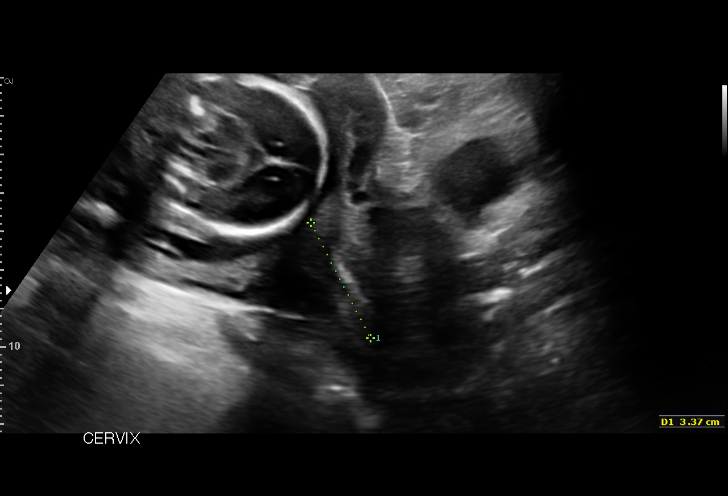
[im 10/84]
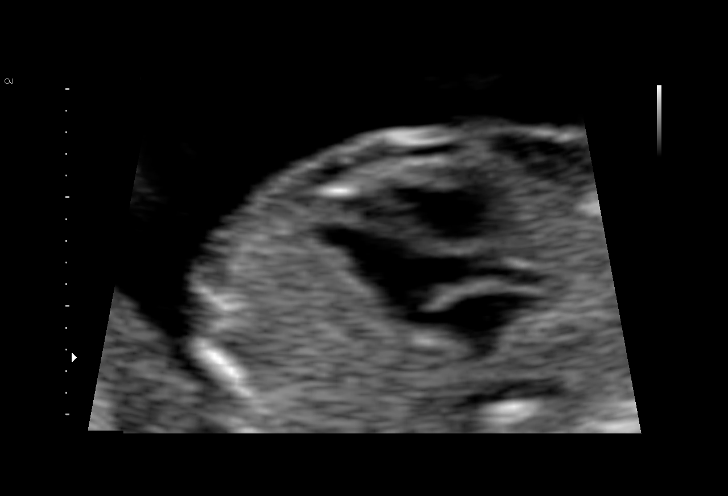
[im 16/84]
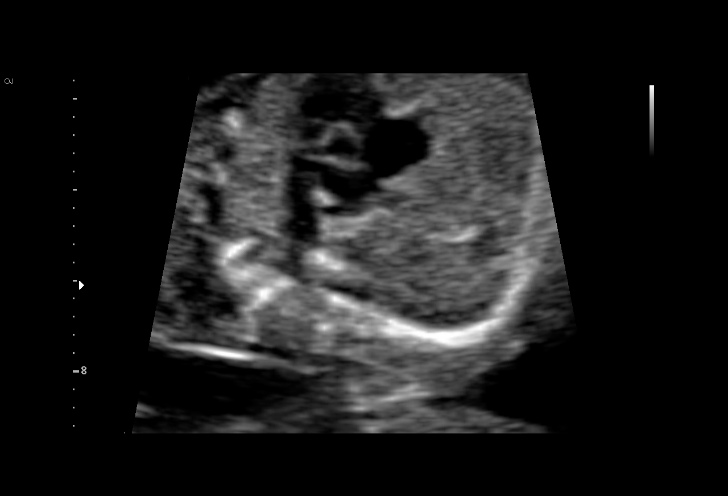
[im 22/84]
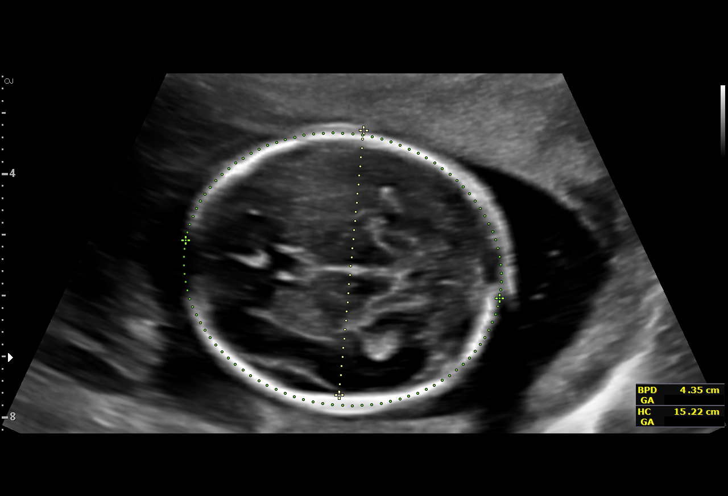
[im 28/84]
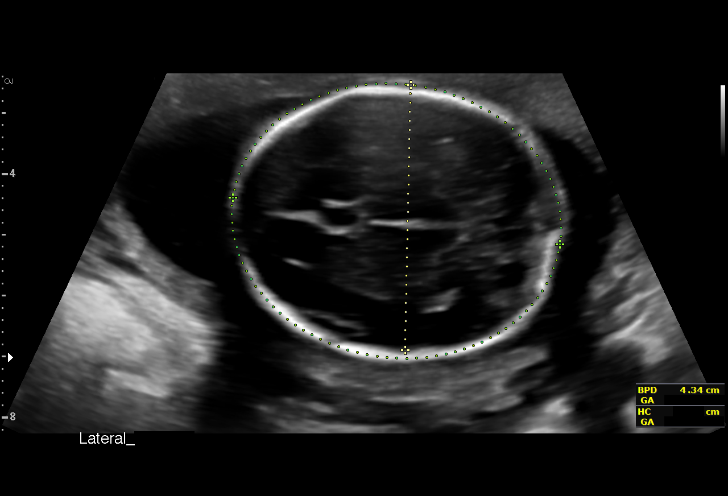
[im 34/84]
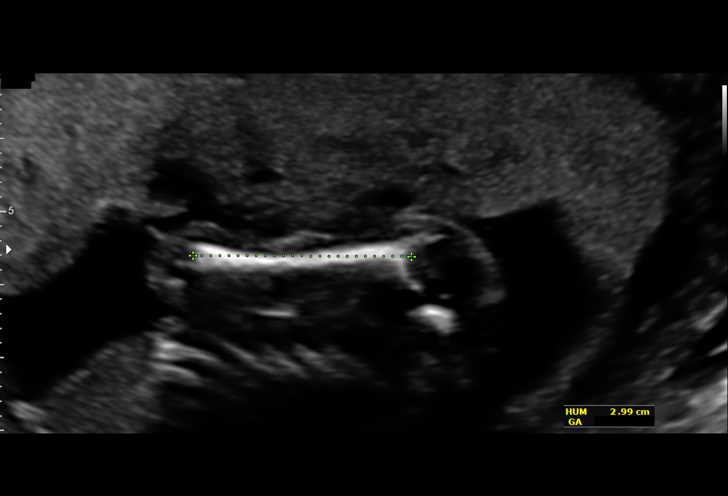
[im 44/84]
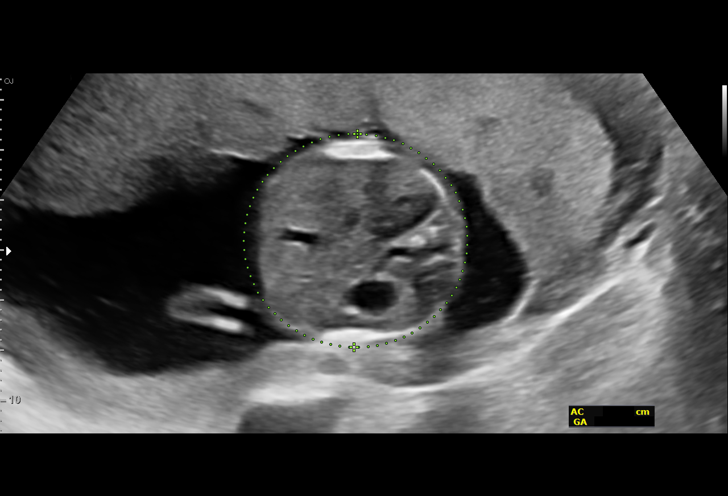
[im 50/84]
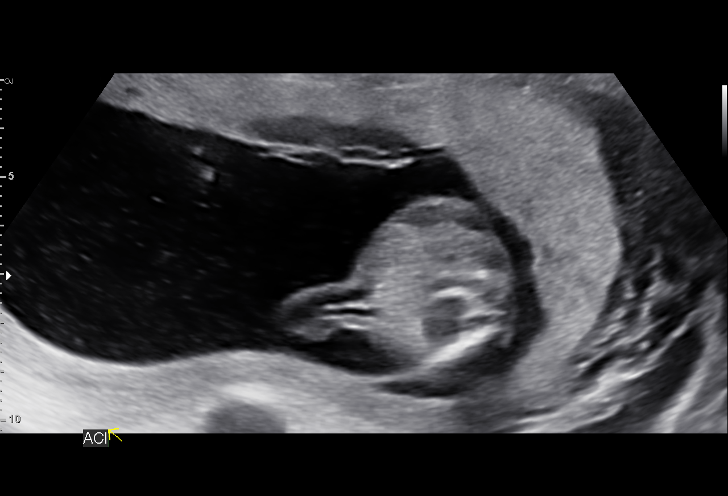
[im 56/84]
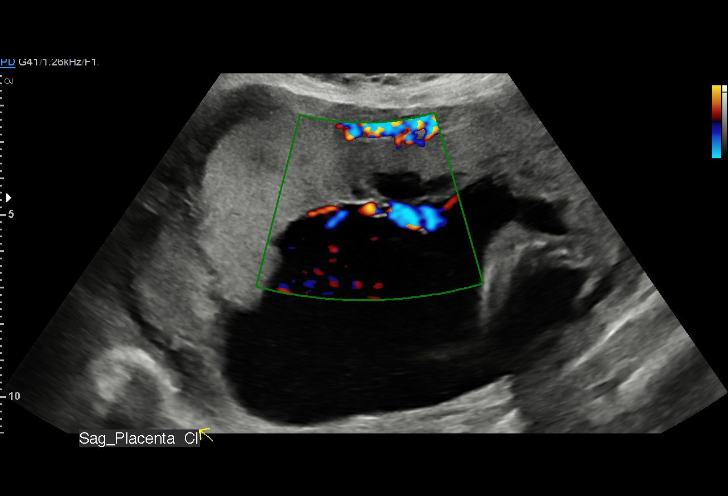
[im 62/84]
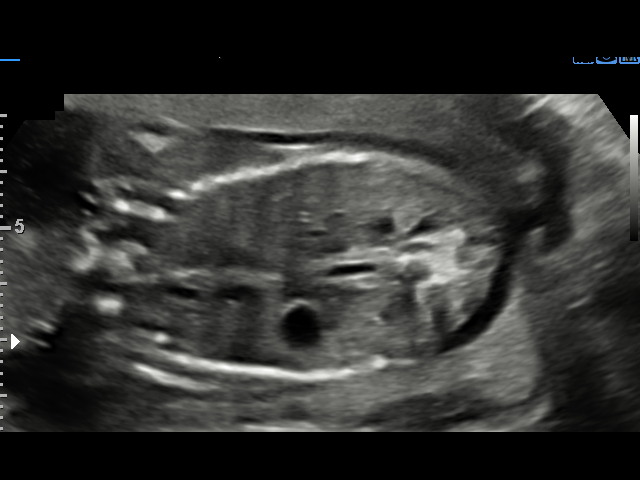
[im 68/84]
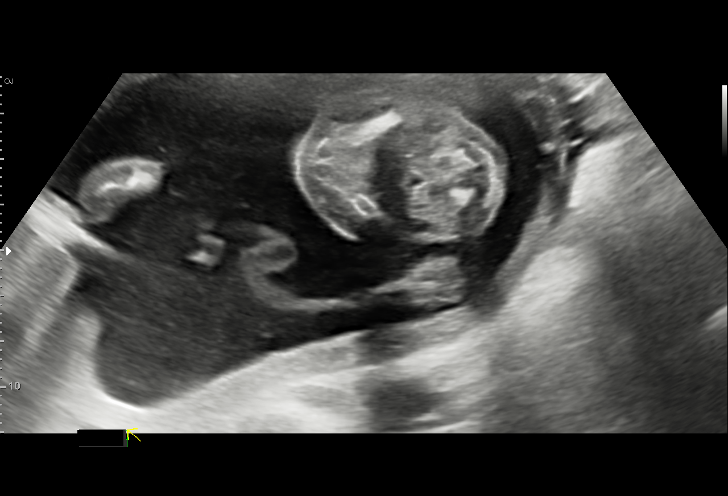
[im 74/84]
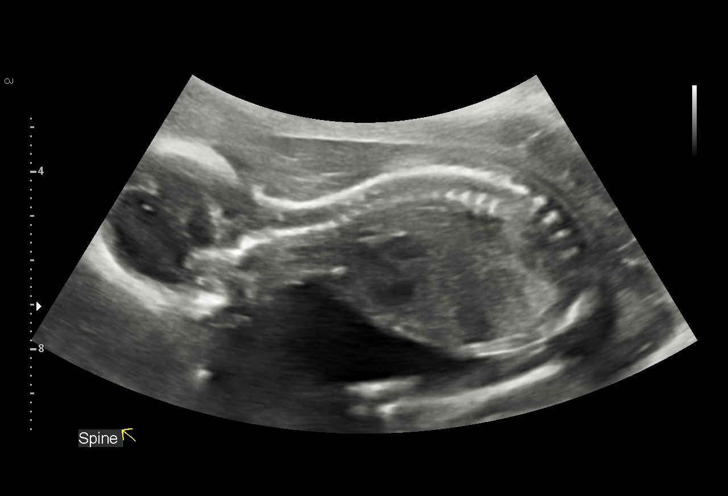
[im 80/84]
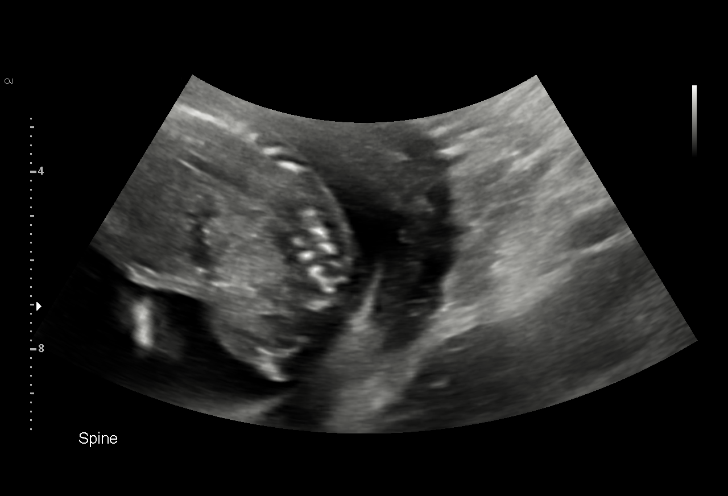

[13 of 28 positions shown; findings below may reference images not displayed]

FINDINGS: 1. Single intrauterine pregnancy.
2. Fetal biometry is consistent with dating.
3. Anterior placenta without evidence of previa.
4. Normal amniotic fluid volume.
5. Normal transabdominal cervical length.
6. The views of the spine are limited.
7. The remainder of the limited anatomy survey is normal.
Recommendations

1. Appropriate fetal growth.
2. Limited anatomy survey:
- recommend follow up in 6 weeks to reevaluate spine
3. Advanced maternal age:
- previously counseled
- low risk cell free fetal DNA
4. Elevated inhibin:
- discussed association with increased risk of preeclampsia,
fetal growth restriction, stillbirth, abruption, and preterm
delivery
- recommend serial fetal growth every 6-8 weeks
- recommend close surveillance for the development of
signs/symptoms of preeclampsia
5. History of eclampsia:
- discussed increased risk of recurrence
- encouraged patient to start her low dose aspirin and
discussed role in reduction of risk of preeclampsia/eclampsia
- recommend close surveillance for the development of
signs/symptoms of preeclampsia

## 2021-08-12 ENCOUNTER — Ambulatory Visit (INDEPENDENT_AMBULATORY_CARE_PROVIDER_SITE_OTHER): Payer: Medicaid Other | Admitting: Primary Care

## 2023-01-19 ENCOUNTER — Other Ambulatory Visit: Payer: Self-pay | Admitting: Obstetrics & Gynecology

## 2023-01-19 DIAGNOSIS — Z1231 Encounter for screening mammogram for malignant neoplasm of breast: Secondary | ICD-10-CM

## 2023-01-24 ENCOUNTER — Ambulatory Visit
Admission: RE | Admit: 2023-01-24 | Discharge: 2023-01-24 | Disposition: A | Discharge status: Home / Self Care | Payer: No Typology Code available for payment source | Source: Ambulatory Visit | Attending: Obstetrics & Gynecology | Admitting: Obstetrics & Gynecology

## 2023-01-24 DIAGNOSIS — Z1231 Encounter for screening mammogram for malignant neoplasm of breast: Secondary | ICD-10-CM

## 2023-06-19 ENCOUNTER — Other Ambulatory Visit: Payer: Self-pay

## 2023-06-19 ENCOUNTER — Emergency Department (HOSPITAL_BASED_OUTPATIENT_CLINIC_OR_DEPARTMENT_OTHER)
Admission: EM | Admit: 2023-06-19 | Discharge: 2023-06-19 | Disposition: A | Discharge status: Home / Self Care | Attending: Emergency Medicine | Admitting: Emergency Medicine

## 2023-06-19 ENCOUNTER — Encounter (HOSPITAL_BASED_OUTPATIENT_CLINIC_OR_DEPARTMENT_OTHER): Payer: Self-pay

## 2023-06-19 ENCOUNTER — Emergency Department (HOSPITAL_BASED_OUTPATIENT_CLINIC_OR_DEPARTMENT_OTHER)

## 2023-06-19 DIAGNOSIS — R051 Acute cough: Secondary | ICD-10-CM

## 2023-06-19 DIAGNOSIS — R6883 Chills (without fever): Secondary | ICD-10-CM | POA: Insufficient documentation

## 2023-06-19 DIAGNOSIS — Z79899 Other long term (current) drug therapy: Secondary | ICD-10-CM | POA: Insufficient documentation

## 2023-06-19 DIAGNOSIS — R059 Cough, unspecified: Secondary | ICD-10-CM | POA: Insufficient documentation

## 2023-06-19 DIAGNOSIS — R0981 Nasal congestion: Secondary | ICD-10-CM | POA: Diagnosis not present

## 2023-06-19 DIAGNOSIS — I10 Essential (primary) hypertension: Secondary | ICD-10-CM | POA: Insufficient documentation

## 2023-06-19 DIAGNOSIS — R058 Other specified cough: Secondary | ICD-10-CM | POA: Diagnosis not present

## 2023-06-19 LAB — RESP PANEL BY RT-PCR (RSV, FLU A&B, COVID)  RVPGX2
Influenza A by PCR: NEGATIVE
Influenza B by PCR: NEGATIVE
Resp Syncytial Virus by PCR: NEGATIVE
SARS Coronavirus 2 by RT PCR: NEGATIVE

## 2023-06-19 MED ORDER — DOXYCYCLINE HYCLATE 100 MG PO CAPS: 100.0000 mg | ORAL_CAPSULE | Freq: Two times a day (BID) | ORAL | 0 refills | Status: AC

## 2023-06-19 MED ORDER — AMLODIPINE BESYLATE 5 MG PO TABS: 5.0000 mg | ORAL_TABLET | Freq: Every day | ORAL | 3 refills | Status: DC

## 2023-06-19 NOTE — Discharge Instructions (Addendum)
 You were seen for your cough in the emergency department.   At home, please use Tylenol and ibuprofen for your muscle aches and fevers.  Please use over-the-counter cough medication or tea with honey for your cough. Take the antibiotics in case you have a pneumonia that is not showing up on the x-ray that was taken today.   Take the amlodipine for your blood pressure.   Follow-up with your primary doctor in 2-3 days regarding your visit.  This may be over the phone. If you do not have a primary care doctor you may follow-up with [facility] which is listed in this packet.  Return immediately to the emergency department if you experience any of the following: Difficulty breathing, or any other concerning symptoms.    Thank you for visiting our Emergency Department. It was a pleasure taking care of you today.

## 2023-06-19 NOTE — ED Provider Notes (Signed)
 [place] EMERGENCY DEPARTMENT AT [facility] Provider Note   CSN: [id number] Arrival date & time: 06/19/23  1810     History  Chief Complaint  Patient presents with   Cough    Beth Park is a 43 y.o. female.  43 year old female with history of hypertension who presents to the emergency department with chills, cough, and congestion.  Symptoms started on Thursday of last week.  Does have some sick contacts.  Cough is productive with green sputum.  No objective fevers.  Has been trying multiple OTC medications to help such as Mucinex without relief.  Says that she is also having some mild shortness of breath with exertion.  No leg swelling.        Home Medications Prior to Admission medications   Medication Sig Start Date End Date Taking? Authorizing Provider  doxycycline (VIBRAMYCIN) 100 MG capsule Take 1 capsule (100 mg total) by mouth 2 (two) times daily for 5 days. 06/19/23 06/24/23 Yes Beth Baton, MD  amLODipine (NORVASC) 5 MG tablet Take 1 tablet (5 mg total) by mouth daily. 06/19/23   Beth Baton, MD      Allergies    Patient has no known allergies.    Review of Systems   Review of Systems  Physical Exam Updated Vital Signs BP (!) 157/109   Pulse (!) 106   Temp 98.4 F (36.9 C) (Oral)   Resp 18   Ht 5\' 6"  (1.676 m)   Wt 92.9 kg   LMP 05/27/2023 (Approximate)   SpO2 99%   BMI 33.07 kg/m  Physical Exam Vitals and nursing note reviewed.  Constitutional:      General: She is not in acute distress.    Appearance: She is well-developed.  HENT:     Head: Normocephalic and atraumatic.     Right Ear: External ear normal.     Left Ear: External ear normal.     Nose: Congestion present.     Comments: Bilateral maxillary sinus tenderness palpation Eyes:     Extraocular Movements: Extraocular movements intact.     Conjunctiva/sclera: Conjunctivae normal.     Pupils: Pupils are equal, round, and reactive to light.  Cardiovascular:      Rate and Rhythm: Normal rate and regular rhythm.     Heart sounds: No murmur heard. Pulmonary:     Effort: Pulmonary effort is normal. No respiratory distress.     Breath sounds: Rhonchi (LLL) present.  Musculoskeletal:     Cervical back: Normal range of motion and neck supple.     Right lower leg: No edema.     Left lower leg: No edema.  Skin:    General: Skin is warm and dry.  Neurological:     Mental Status: She is alert and oriented to person, place, and time. Mental status is at baseline.  Psychiatric:        Mood and Affect: Mood normal.     ED Results / Procedures / Treatments   Labs (all labs ordered are listed, but only abnormal results are displayed) Labs Reviewed  RESP PANEL BY RT-PCR (RSV, FLU A&B, COVID)  [id number]    EKG None  Radiology DG Chest Portable 1 View Result Date: 06/19/2023 CLINICAL DATA:  Productive cough EXAM: PORTABLE CHEST 1 VIEW COMPARISON:  None Available. FINDINGS: Cardiac shadow is within normal limits. The lungs are well aerated bilaterally. No focal infiltrate or effusion is seen. No bony abnormality is noted. IMPRESSION: No active disease. Electronically Signed  By: Beth Park M.D.   On: 06/19/2023 21:24    Procedures Procedures    Medications Ordered in ED Medications - No data to display  ED Course/ Medical Decision Making/ A&P                                 Medical Decision Making Risk Prescription drug management.   Beth Park is a 43 y.o. female with comorbidities that complicate the patient evaluation including hypertension who presents emergency department with cough and congestion  Initial Ddx:  URI, sinusitis, pneumonia, DVT/PE  MDM/Course:  Patient presents emergency department with cough and symptoms of URI.  Does have sinus tenderness to palpation as well as rhonchi in the left lower lobe but breathing is normal.  Satting well on room air.  Does not appear to be in acute respiratory distress.  Chest x-ray  without evidence of pneumonia.  COVID and flu negative.  Did consider DVT/PE but without any leg swelling and with the infectious symptoms that she is having feel this is less likely.  Because of her symptoms we will go ahead and give her doxycycline in case of an occult pneumonia especially with the rhonchi in her left lower lobe.  Counseled on symptomatic treatment for her other symptoms as well.  Will have her follow-up with her primary doctor.  Patient blood pressure was elevated in the 150s and she is out of her amlodipine I will have refilled that and have her follow-up with her primary doctor as well.   This patient presents to the ED for concern of complaints listed in HPI, this involves an extensive number of treatment options, and is a complaint that carries with it a high risk of complications and morbidity. Disposition including potential need for admission considered.   Dispo: DC Home. Return precautions discussed including, but not limited to, those listed in the AVS. Allowed pt time to ask questions which were answered fully prior to dc.  Records reviewed Outpatient Clinic Notes I independently reviewed the following imaging with scope of interpretation limited to determining acute life threatening conditions related to emergency care: Chest x-ray and agree with the radiologist interpretation with the following exceptions: none I personally reviewed and interpreted cardiac monitoring: normal sinus rhythm  I personally reviewed and interpreted the pt's EKG: see above for interpretation  I have reviewed the patients home medications and made adjustments as needed  Portions of this note were generated with Dragon dictation software. Dictation errors may occur despite best attempts at proofreading.     Final Clinical Impression(s) / ED Diagnoses Final diagnoses:  Acute cough  Uncontrolled hypertension    Rx / DC Orders ED Discharge Orders          Ordered    amLODipine (NORVASC)  5 MG tablet  Daily        06/19/23 2047    doxycycline (VIBRAMYCIN) 100 MG capsule  2 times daily        06/19/23 2059              Beth Baton, MD 06/19/23 2135

## 2023-06-19 NOTE — ED Triage Notes (Signed)
 In for eval of chills, productive cough with green sputum, and nasal congestion onset Thursday.

## 2023-07-10 ENCOUNTER — Encounter (INDEPENDENT_AMBULATORY_CARE_PROVIDER_SITE_OTHER): Payer: Self-pay | Admitting: Primary Care

## 2023-07-10 ENCOUNTER — Ambulatory Visit (INDEPENDENT_AMBULATORY_CARE_PROVIDER_SITE_OTHER): Payer: No Typology Code available for payment source | Admitting: Primary Care

## 2023-07-10 VITALS — BP 113/75 | HR 95 | Resp 16 | Ht 66.0 in | Wt 215.4 lb

## 2023-07-10 DIAGNOSIS — Z720 Tobacco use: Secondary | ICD-10-CM

## 2023-07-10 DIAGNOSIS — Z13220 Encounter for screening for lipoid disorders: Secondary | ICD-10-CM | POA: Diagnosis not present

## 2023-07-10 DIAGNOSIS — Z7689 Persons encountering health services in other specified circumstances: Secondary | ICD-10-CM

## 2023-07-10 DIAGNOSIS — E6609 Other obesity due to excess calories: Secondary | ICD-10-CM

## 2023-07-10 DIAGNOSIS — I10 Essential (primary) hypertension: Secondary | ICD-10-CM

## 2023-07-10 DIAGNOSIS — F17210 Nicotine dependence, cigarettes, uncomplicated: Secondary | ICD-10-CM | POA: Diagnosis not present

## 2023-07-10 DIAGNOSIS — Z1159 Encounter for screening for other viral diseases: Secondary | ICD-10-CM

## 2023-07-10 DIAGNOSIS — Z8632 Personal history of gestational diabetes: Secondary | ICD-10-CM

## 2023-07-10 DIAGNOSIS — Z6830 Body mass index (BMI) 30.0-30.9, adult: Secondary | ICD-10-CM

## 2023-07-10 DIAGNOSIS — E66811 Obesity, class 1: Secondary | ICD-10-CM

## 2023-07-10 MED ORDER — AMLODIPINE BESYLATE 5 MG PO TABS: 5.0000 mg | ORAL_TABLET | Freq: Every day | ORAL | 0 refills | Status: DC

## 2023-07-10 MED ORDER — AMLODIPINE BESYLATE 5 MG PO TABS: 5.0000 mg | ORAL_TABLET | Freq: Every day | ORAL | 0 refills | Status: AC

## 2023-07-10 NOTE — Progress Notes (Signed)
 New Patient Office Visit  Subjective    Patient ID: Beth Park female  DOB: 16-Nov-1980  Age: 43 y.o. MRN: [id number]   CC:  Establish care   HPI  [name] is a 43 year old obese female in today to establish care.  She has a history of hypertension and taking amlodipine 5 mg which is well-controlled.  She also monitors her sodium intake and exercise.  Patient also is concerned about her weight which causes her to not be as sociable as she would like and causing depression. No current outpatient medications on file prior to visit.   No current facility-administered medications on file prior to visit.     No Known Allergies  Past Medical History:  Diagnosis Date   History of eclampsia 10/13/2016   First pregnancy 1wk postpartum   Pregnancy induced hypertension      Past Surgical History:  Procedure Laterality Date   DILATION AND CURETTAGE OF UTERUS     FOOT SURGERY Left    TUBAL LIGATION Bilateral 03/13/2017   Procedure: POST PARTUM TUBAL LIGATION  Patient DOES NOT have epidural;  Surgeon: Tereso Newcomer, MD;  Location: [facility];  Service: Gynecology;  Laterality: Bilateral;     Family History  Problem Relation Age of Onset   Hypertension Mother    Breast cancer Maternal Aunt    Stroke Maternal Grandmother    Cancer Maternal Grandfather     Social History   Socioeconomic History   Marital status: Single    Spouse name: Not on file   Number of children: Not on file   Years of education: Not on file   Highest education level: Not on file  Occupational History   Not on file  Tobacco Use   Smoking status: Every Day    Current packs/day: 0.50    Types: Cigarettes   Smokeless tobacco: Never  Vaping Use   Vaping status: Never Used  Substance and Sexual Activity   Alcohol use: No   Drug use: No   Sexual activity: Yes    Birth control/protection: None  Other Topics Concern   Not on file  Social History Narrative   Not on file    Social Drivers of Health   Financial Resource Strain: Not on file  Food Insecurity: Not on file  Transportation Needs: Not on file  Physical Activity: Not on file  Stress: Not on file  Social Connections: Not on file  Intimate Partner Violence: Not on file    Health Maintenance  Topic Date Due   Hepatitis C Screening  Never done   Pneumococcal Vaccination (1 of 2 - PCV) Never done   Pap with HPV screening  10/13/2021   COVID-19 Vaccine (1 - 2024-25 season) Never done   Flu Shot  10/27/2023   DTaP/Tdap/Td vaccine (3 - Td or Tdap) 12/23/2026   HIV Screening  Completed   HPV Vaccine  Aged Out   Meningitis B Vaccine  Aged Out    Objective    BP 113/75   Pulse 95   Resp 16   Ht 5\' 6"  (1.676 m)   Wt 215 lb 6.4 oz (97.7 kg)   LMP 05/27/2023 (Approximate)   SpO2 98%   BMI 34.77 kg/m    Physical Exam Vitals reviewed.  Constitutional:      Appearance: Normal appearance. She is obese.  HENT:     Head: Normocephalic.     Right Ear: Tympanic membrane, ear canal and external ear normal.  Left Ear: Tympanic membrane, ear canal and external ear normal.     Nose: Nose normal.     Mouth/Throat:     Mouth: Mucous membranes are moist.  Eyes:     Extraocular Movements: Extraocular movements intact.     Pupils: Pupils are equal, round, and reactive to light.  Cardiovascular:     Rate and Rhythm: Normal rate.  Pulmonary:     Effort: Pulmonary effort is normal.     Breath sounds: Normal breath sounds.  Abdominal:     General: Bowel sounds are normal. There is distension.     Palpations: Abdomen is soft.  Musculoskeletal:        General: Normal range of motion.     Cervical back: Normal range of motion.  Skin:    General: Skin is warm and dry.  Neurological:     Mental Status: She is alert and oriented to person, place, and time.  Psychiatric:        Mood and Affect: Mood normal.        Behavior: Behavior normal.        Thought Content: Thought content normal.       Assessment & Plan:  Annali was seen today for new patient (initial visit) and hypertension.  Diagnoses and all orders for this visit:  Encounter to establish care  History of gestational diabetes mellitus -     CBC with Differential/Platelet -     Hemoglobin A1c  Essential hypertension BP goal - < 130/80 Explained that having normal blood pressure is the goal and medications are helping to get to goal and maintain normal blood pressure. DIET: Limit salt intake, read nutrition labels to check salt content, limit fried and high fatty foods  Avoid using multisymptom OTC cold preparations that generally contain sudafed which can rise BP. Consult with pharmacist on best cold relief products to use for persons with HTN EXERCISE Discussed incorporating exercise such as walking - 30 minutes most days of the week and can do in 10 minute intervals    -     CMP14+EGFR -     amLODipine (NORVASC) 5 MG tablet; Take 1 tablet (5 mg total) by mouth daily.  Class 1 obesity due to excess calories without serious comorbidity with body mass index (BMI) of 30.0 to 30.9 in adult See HPI Obesity is 30-39 indicating an excess in caloric intake or underlining conditions. This may lead to other co-morbidities. Educated on lifestyle modifications of diet and exercise which may reduce obesity.   -     Lipid panel  Encounter for HCV screening test for low risk patient -     HCV Ab w Reflex to Quant PCR  Lipid screening -     Lipid panel  Other orders -     Cancel: Pneumococcal polysaccharide vaccine 23-valent greater than or equal to 2yo subcutaneous/IM   Tobacco abuse  - I have recommended complete cessation of tobacco use. I have discussed various options available for assistance with tobacco cessation including over the counter methods (Nicotine gum, patch and lozenges). We also discussed prescription options (Chantix, Nicotine Inhaler / Nasal Spray). The patient is not interested in pursuing any  prescription tobacco cessation options at this time. - Patient declines at this time.  - Less than 5 minutes spent on counseling.    Follow-up:  schedule a pap  The above assessment and management plan was discussed with the patient. The patient verbalized understanding of and has agreed to the  management plan. Patient is aware to call the clinic if symptoms fail to improve or worsen. Patient is aware when to return to the clinic for a follow-up visit. Patient educated on when it is appropriate to go to the emergency department.   Madelyn Schick, NP-C

## 2023-07-11 LAB — CBC WITH DIFFERENTIAL/PLATELET
Basophils Absolute: 0 10*3/uL (ref 0.0–0.2)
Basos: 0 %
EOS (ABSOLUTE): 0.2 10*3/uL (ref 0.0–0.4)
Eos: 2 %
Hematocrit: 39.2 % (ref 34.0–46.6)
Hemoglobin: 12.2 g/dL (ref 11.1–15.9)
Immature Grans (Abs): 0 10*3/uL (ref 0.0–0.1)
Immature Granulocytes: 0 %
Lymphocytes Absolute: 3 10*3/uL (ref 0.7–3.1)
Lymphs: 33 %
MCH: 27.7 pg (ref 26.6–33.0)
MCHC: 31.1 g/dL — ABNORMAL LOW (ref 31.5–35.7)
MCV: 89 fL (ref 79–97)
Monocytes Absolute: 0.6 10*3/uL (ref 0.1–0.9)
Monocytes: 7 %
Neutrophils Absolute: 5.2 10*3/uL (ref 1.4–7.0)
Neutrophils: 58 %
Platelets: 359 10*3/uL (ref 150–450)
RBC: 4.41 x10E6/uL (ref 3.77–5.28)
RDW: 12.6 % (ref 11.7–15.4)
WBC: 9 10*3/uL (ref 3.4–10.8)

## 2023-07-11 LAB — HEMOGLOBIN A1C
Est. average glucose Bld gHb Est-mCnc: 117 mg/dL
Hgb A1c MFr Bld: 5.7 % — ABNORMAL HIGH (ref 4.8–5.6)

## 2023-07-11 LAB — CMP14+EGFR
ALT: 19 IU/L (ref 0–32)
AST: 16 IU/L (ref 0–40)
Albumin: 4.1 g/dL (ref 3.9–4.9)
Alkaline Phosphatase: 69 IU/L (ref 44–121)
BUN/Creatinine Ratio: 13 (ref 9–23)
BUN: 9 mg/dL (ref 6–24)
Bilirubin Total: <0.2 mg/dL (ref 0.0–1.2)
CO2: 23 mmol/L (ref 20–29)
Calcium: 9.2 mg/dL (ref 8.7–10.2)
Chloride: 106 mmol/L (ref 96–106)
Creatinine, Ser: 0.72 mg/dL (ref 0.57–1.00)
Globulin, Total: 2.2 g/dL (ref 1.5–4.5)
Glucose: 63 mg/dL — ABNORMAL LOW (ref 70–99)
Potassium: 4.3 mmol/L (ref 3.5–5.2)
Sodium: 140 mmol/L (ref 134–144)
Total Protein: 6.3 g/dL (ref 6.0–8.5)
eGFR: 107 mL/min/{1.73_m2} (ref >59)

## 2023-07-11 LAB — LIPID PANEL
Chol/HDL Ratio: 2.5 ratio (ref 0.0–4.4)
Cholesterol, Total: 152 mg/dL (ref 100–199)
HDL: 60 mg/dL (ref >39)
LDL Chol Calc (NIH): 74 mg/dL (ref 0–99)
Triglycerides: 95 mg/dL (ref 0–149)
VLDL Cholesterol Cal: 18 mg/dL (ref 5–40)

## 2023-07-11 LAB — HCV AB W REFLEX TO QUANT PCR: HCV Ab: NONREACTIVE

## 2023-07-11 LAB — HCV INTERPRETATION

## 2023-07-25 ENCOUNTER — Telehealth (INDEPENDENT_AMBULATORY_CARE_PROVIDER_SITE_OTHER): Payer: Self-pay | Admitting: Primary Care

## 2023-07-25 NOTE — Telephone Encounter (Signed)
 Called pt to confirm appt. Pt will be present.

## 2023-08-01 ENCOUNTER — Telehealth (INDEPENDENT_AMBULATORY_CARE_PROVIDER_SITE_OTHER): Payer: Self-pay | Admitting: Primary Care

## 2023-08-01 NOTE — Telephone Encounter (Signed)
 Called pt to confirm appt. Pt did not answer and LVM

## 2023-08-02 ENCOUNTER — Other Ambulatory Visit (HOSPITAL_COMMUNITY)
Admission: RE | Admit: 2023-08-02 | Discharge: 2023-08-02 | Disposition: A | Discharge status: Home / Self Care | Source: Ambulatory Visit | Attending: Primary Care | Admitting: Primary Care

## 2023-08-02 ENCOUNTER — Ambulatory Visit (INDEPENDENT_AMBULATORY_CARE_PROVIDER_SITE_OTHER): Admitting: Primary Care

## 2023-08-02 ENCOUNTER — Encounter (INDEPENDENT_AMBULATORY_CARE_PROVIDER_SITE_OTHER): Payer: Self-pay | Admitting: Primary Care

## 2023-08-02 VITALS — BP 105/72 | HR 91 | Resp 16 | Ht 66.0 in | Wt 210.6 lb

## 2023-08-02 DIAGNOSIS — N62 Hypertrophy of breast: Secondary | ICD-10-CM

## 2023-08-02 DIAGNOSIS — I10 Essential (primary) hypertension: Secondary | ICD-10-CM | POA: Diagnosis not present

## 2023-08-02 DIAGNOSIS — G8929 Other chronic pain: Secondary | ICD-10-CM

## 2023-08-02 DIAGNOSIS — Z124 Encounter for screening for malignant neoplasm of cervix: Secondary | ICD-10-CM | POA: Diagnosis not present

## 2023-08-02 DIAGNOSIS — Z1151 Encounter for screening for human papillomavirus (HPV): Secondary | ICD-10-CM | POA: Insufficient documentation

## 2023-08-02 DIAGNOSIS — B3731 Acute candidiasis of vulva and vagina: Secondary | ICD-10-CM | POA: Diagnosis not present

## 2023-08-02 DIAGNOSIS — M542 Cervicalgia: Secondary | ICD-10-CM

## 2023-08-02 DIAGNOSIS — M546 Pain in thoracic spine: Secondary | ICD-10-CM | POA: Diagnosis not present

## 2023-08-02 DIAGNOSIS — Z113 Encounter for screening for infections with a predominantly sexual mode of transmission: Secondary | ICD-10-CM | POA: Insufficient documentation

## 2023-08-02 NOTE — Progress Notes (Signed)
 [facility]  WELL-WOMAN PHYSICAL & PAP Patient name: Beth Park MRN [id number]  Date of birth: September 24, 1980 Chief Complaint:   Gynecologic Exam  History of Present Illness:   Beth Park is a 43 y.o. [phone] obese female being seen today for a routine well-woman exam. She is c/o upper body indentation from bra straps bilateral shoulders and cervical pain back pain . We will discuss on f/u.  CC:gyn  The current method of family planning is tubal ligation.  No LMP recorded (lmp unknown). Last pap 10/13/16. Results were: normal Last mammogram: 01/24/23. Results were: normal. Family h/o breast cancer: Yes maternal aunt breast  . Family h/o colorectal cancer: No  Health Maintenance  Topic Date Due   Pneumococcal Vaccination (1 of 2 - PCV) Never done   Pap with HPV screening  10/13/2021   COVID-19 Vaccine (1 - 2024-25 season) Never done   Flu Shot  10/27/2023   DTaP/Tdap/Td vaccine (3 - Td or Tdap) 12/23/2026   Hepatitis C Screening  Completed   HIV Screening  Completed   HPV Vaccine  Aged Out   Meningitis B Vaccine  Aged Out   Review of Systems:    Denies any headaches, blurred vision, fatigue, shortness of breath, chest pain, abdominal pain, abnormal vaginal discharge/itching/odor/irritation, problems with periods, bowel movements, urination, or intercourse unless otherwise stated above.  Pertinent History Reviewed:   Reviewed past medical,surgical, social and family history.  Reviewed problem list, medications and allergies.  Physical Assessment:   Vitals:   08/02/23 0929  BP: 105/72  Pulse: 91  Resp: 16  SpO2: 99%  Weight: 210 lb 9.6 oz (95.5 kg)  Height: 5\' 6"  (1.676 m)  Body mass index is 33.99 kg/m.        Physical Examination:  General appearance - well appearing, and in no distress Mental status - alert, oriented to person, place, and time Psych:  She has a normal mood and affect Skin - warm and dry, normal color, no suspicious lesions  noted Chest - effort normal, all lung fields clear to auscultation bilaterally Heart - normal rate and regular rhythm Neck:  midline trachea, no thyromegaly or nodules Breasts - Macromastia breasts appear normal, suspicious masses bilateral hormonal , no skin or nipple changes or axillary nodes Educated patient on proper self breast examination and had patient to demonstrate SBE. Abdomen - soft, nontender, nondistended, no masses or organomegaly Pelvic-VULVA: normal appearing vulva with no masses, tenderness or lesions   VAGINA: normal appearing vagina with normal color and discharge, no lesions   CERVIX: normal appearing cervix without discharge or lesions, no CMT UTERUS: uterus is felt to be normal size, shape, consistency and nontender  ADNEXA: No adnexal masses or tenderness noted. Extremities:  No swelling or varicosities noted  No results found for this or any previous visit (from the past 24 hours).   Assessment & Plan:  Ceil was seen today for gynecologic exam.  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  This note has been created with Education officer, environmental. Any transcriptional  errors are unintentional.   Marius Siemens, NP 08/02/2023, 9:49 AM

## 2023-08-03 LAB — CERVICOVAGINAL ANCILLARY ONLY
Bacterial Vaginitis (gardnerella): NEGATIVE
Candida Glabrata: POSITIVE — AB
Candida Vaginitis: NEGATIVE
Chlamydia: NEGATIVE
Comment: NEGATIVE
Comment: NEGATIVE
Comment: NEGATIVE
Comment: NEGATIVE
Comment: NEGATIVE
Comment: NORMAL
Neisseria Gonorrhea: NEGATIVE
Trichomonas: NEGATIVE

## 2023-08-03 LAB — CYTOLOGY - PAP
Comment: NEGATIVE
Diagnosis: NEGATIVE
High risk HPV: NEGATIVE

## 2023-08-04 LAB — HIV ANTIBODY (ROUTINE TESTING W REFLEX): HIV Screen 4th Generation wRfx: NONREACTIVE

## 2023-08-05 ENCOUNTER — Other Ambulatory Visit (INDEPENDENT_AMBULATORY_CARE_PROVIDER_SITE_OTHER): Payer: Self-pay | Admitting: Primary Care

## 2023-08-05 DIAGNOSIS — B379 Candidiasis, unspecified: Secondary | ICD-10-CM

## 2023-08-05 MED ORDER — FLUCONAZOLE 150 MG PO TABS: 150.0000 mg | ORAL_TABLET | Freq: Every day | ORAL | 1 refills | Status: AC

## 2023-08-11 ENCOUNTER — Ambulatory Visit (INDEPENDENT_AMBULATORY_CARE_PROVIDER_SITE_OTHER): Payer: Self-pay

## 2023-08-25 ENCOUNTER — Encounter (INDEPENDENT_AMBULATORY_CARE_PROVIDER_SITE_OTHER): Payer: Self-pay
